# Patient Record
Sex: Male | Born: 1970 | Race: White | Hispanic: No | Smoking: Former smoker
Health system: Southern US, Community
[De-identification: ages and names within clinical notes are randomized; demographics above are authoritative.]

## PROBLEM LIST (undated history)

## (undated) DIAGNOSIS — N189 Chronic kidney disease, unspecified: Secondary | ICD-10-CM

## (undated) DIAGNOSIS — J45909 Unspecified asthma, uncomplicated: Secondary | ICD-10-CM

## (undated) DIAGNOSIS — N200 Calculus of kidney: Secondary | ICD-10-CM

## (undated) HISTORY — PX: OTHER SURGICAL HISTORY: SHX169

## (undated) HISTORY — DX: Unspecified asthma, uncomplicated: J45.909

## (undated) HISTORY — PX: HERNIA REPAIR: SHX51

## (undated) HISTORY — PX: EXPLORATORY LAPAROTOMY: SUR591

---

## 2000-02-10 ENCOUNTER — Ambulatory Visit (HOSPITAL_COMMUNITY): Admission: RE | Admit: 2000-02-10 | Discharge: 2000-02-10 | Payer: Self-pay | Admitting: General Surgery

## 2006-07-30 ENCOUNTER — Ambulatory Visit (HOSPITAL_COMMUNITY): Admission: RE | Admit: 2006-07-30 | Discharge: 2006-07-30 | Payer: Self-pay | Admitting: Urology

## 2009-10-24 ENCOUNTER — Ambulatory Visit (HOSPITAL_COMMUNITY): Admission: RE | Admit: 2009-10-24 | Discharge: 2009-10-26 | Payer: Self-pay | Admitting: Urology

## 2010-09-10 LAB — TYPE AND SCREEN
ABO/RH(D): O POS
Antibody Screen: NEGATIVE

## 2010-09-10 LAB — CBC
HCT: 41.4 % (ref 39.0–52.0)
Hemoglobin: 13.6 g/dL (ref 13.0–17.0)
Hemoglobin: 15.4 g/dL (ref 13.0–17.0)
MCHC: 32.8 g/dL (ref 30.0–36.0)
MCV: 92.2 fL (ref 78.0–100.0)
Platelets: 293 10*3/uL (ref 150–400)
RBC: 4.47 MIL/uL (ref 4.22–5.81)
RBC: 5.04 MIL/uL (ref 4.22–5.81)
WBC: 14.1 10*3/uL — ABNORMAL HIGH (ref 4.0–10.5)
WBC: 8.7 10*3/uL (ref 4.0–10.5)

## 2010-11-08 NOTE — Op Note (Signed)
[facility]  Patient:    Todd Aguirre, Todd Aguirre                   MRN: [id number] Proc. Date: 02/10/00 Adm. Date:  [id number] Attending:  Glenna Fellows Tappan                           Operative Report  PREOPERATIVE DIAGNOSIS:  Bilateral inguinal hernia.  POSTOPERATIVE DIAGNOSIS:  Bilateral inguinal hernia.  PROCEDURE:  Laparoscopic repair of bilateral inguinal hernia.  SURGEON:  Dr. Johna Sheriff.  ANESTHESIA:  General.  BRIEF HISTORY:  Todd Aguirre is a 40 year old white male with a history of gradually enlarging bilateral uncomfortable inguinal hernias. Exam confirms the bilateral hernias. Options for repair were discussed and we have elected to proceed with laparoscopic bilateral repair. The nature of the procedure, its indications, risks of bleeding, infection, and recurrence were discussed and understood preoperatively. He is now brought to the operating room for this procedure.  DESCRIPTION OF PROCEDURE:  The patient was brought to the operating room, placed in supine position on the operating table and general endotracheal anesthesia was induced. A Foley catheter was placed. Antibiotics were given preoperatively. The abdomen and the groin were sterilely prepped and draped. A 1 cm incision was made at the umbilicus and the anterior fascia was incised transversely for 1 cm and the preperitoneal space entered. The balloon dissector was placed posterior to the right rectus muscle and passed with its tip to the pubic tubercle. It was inflated 25 pumps with good bilateral deployment of the balloon. The balloon was left inflated for 3 minutes and then removed and the structural balloon and CO2 pressure were applied. There was fairly good bilateral dissection. The pubic symphysis was identified. Initially on the right, the Coopers ligament was cleared down to the iliac vessel which were identified and carefully protected. The epigastric  vessels were well defined. The peritoneum was dissected off the anterior abdominal wall working laterally out to the anterior superior iliac spine. The peritoneum was dissected posteriorly working back toward the cord structures. The cord was dissected free of the internal ring and skeletonized. The peritoneum was dissected well off the cord posteriorly and there was no indirect component. There was a moderate sized direct defect present on the right. Following thorough dissection, the left side was dissected identically with essentially identical findings except for a larger direct hernia present here. Following this, the initial piece of mesh was trimmed to 4 x 6 inches and was placed on the left. The mesh was unfurled in position and tacked initially to the pubic symphysis and then to the Coopers ligament down to the iliac vessels which were protected. The mesh was tacked along the midline working inferior to superior and then along the anterior abdominal wall working medial to lateral bordering the epigastric vessels and placing the superior lateral corner out to the iliac crest. This provided nice wide coverage of the direct and indirect spaces. Following this, a piece of mesh was identically trimmed and placed and tacked on the right side. A pneumoperitoneum was noted although there was no apparent hole in the peritoneum. This was evacuated with a Veress needle. The lateral inferior corners of the mesh were held in place while CO2 was removed and the trocars were removed. The fascial defects were closed with umbilicus with figure-of-eight suture of #0 Vicryl. The skin incisions were closed with interrupted 4-0 monocryl and Steri-Strips. Sponge, needle and instrument  counts were correct. Dry sterile dressings were applied and the patient was taken to the recovery room in good condition. DD:  02/10/00 TD:  02/10/00 Job: [id number]

## 2012-11-30 ENCOUNTER — Encounter (INDEPENDENT_AMBULATORY_CARE_PROVIDER_SITE_OTHER): Payer: Self-pay

## 2012-11-30 ENCOUNTER — Telehealth (INDEPENDENT_AMBULATORY_CARE_PROVIDER_SITE_OTHER): Payer: Self-pay

## 2012-11-30 NOTE — Telephone Encounter (Signed)
Attempted to reach the patient.  Voice mail not set up.  Work number invalid.  Pt is referred by [facility] for thyroid.  Calcium and parathyroid hormone levels drawn but do not know by whom.  Dr. Retta Diones does not have.

## 2012-12-02 ENCOUNTER — Encounter (INDEPENDENT_AMBULATORY_CARE_PROVIDER_SITE_OTHER): Payer: Self-pay

## 2012-12-29 ENCOUNTER — Ambulatory Visit (INDEPENDENT_AMBULATORY_CARE_PROVIDER_SITE_OTHER): Payer: 59 | Admitting: General Surgery

## 2013-01-21 ENCOUNTER — Encounter (INDEPENDENT_AMBULATORY_CARE_PROVIDER_SITE_OTHER): Payer: Self-pay

## 2013-02-11 ENCOUNTER — Ambulatory Visit (INDEPENDENT_AMBULATORY_CARE_PROVIDER_SITE_OTHER): Payer: 59 | Admitting: General Surgery

## 2013-03-22 ENCOUNTER — Encounter (INDEPENDENT_AMBULATORY_CARE_PROVIDER_SITE_OTHER): Payer: Self-pay | Admitting: General Surgery

## 2013-03-22 ENCOUNTER — Ambulatory Visit (INDEPENDENT_AMBULATORY_CARE_PROVIDER_SITE_OTHER): Payer: 59 | Admitting: General Surgery

## 2013-03-22 VITALS — BP 122/80 | HR 72 | Resp 14 | Ht 69.0 in | Wt 138.8 lb

## 2013-03-22 DIAGNOSIS — E210 Primary hyperparathyroidism: Secondary | ICD-10-CM

## 2013-03-22 NOTE — Patient Instructions (Signed)
Recommend a Sestamibi scan as we discussed.

## 2013-03-22 NOTE — Progress Notes (Addendum)
Patient ID: Todd Aguirre, male   DOB: Feb 10, 1971, 42 y.o.   MRN: [id number]  Chief Complaint  Patient presents with  . New Evaluation    eval hyperparathyroidism    HPI Todd Aguirre is a 42 y.o. male.   HPI  He is referred by Dr. Retta Diones for further evaluation and treatment of hyperparathyroidism.  He has had problems with kidney stones since he was 18. His kidney stones are calcium oxalate. 24 hour urine collection  demonstrated over saturation of calcium oxalate. He was noted to have hypercalcemia. Ionized calcium is elevated at 1.36. Parathyroid hormone is elevated at 117.7. His father has kidney stones.  Past Medical History  Diagnosis Date  . Asthma     Past Surgical History  Procedure Laterality Date  . Exploratory laparotomy    . Hernia repair      inguinal x 2  . Percutaneous lithotomy      stone over 2 cm    Family History  Problem Relation Age of Onset  . Cancer Mother     breast  . Cancer Father     throat    Social History History  Substance Use Topics  . Smoking status: Former Games developer  . Smokeless tobacco: Never Used  . Alcohol Use: Yes    Allergies  Allergen Reactions  . Oxycodone Hcl     No current outpatient prescriptions on file.   No current facility-administered medications for this visit.    Review of Systems Review of Systems  Constitutional: Negative.   Respiratory: Negative.   Cardiovascular: Negative.   Gastrointestinal: Positive for abdominal pain, diarrhea and constipation.  Genitourinary: Negative for difficulty urinating.  Hematological: Negative.     Blood pressure 122/80, pulse 72, resp. rate 14, height 5\' 9"  (1.753 m), weight 138 lb 12.8 oz (62.959 kg).  Physical Exam Physical Exam  Constitutional: No distress.  A thin male  HENT:  Head: Normocephalic and atraumatic.  Eyes: No scleral icterus.  Neck: Neck supple. No tracheal deviation present. No thyromegaly present.  No palpable mass  Cardiovascular:  Normal rate and regular rhythm.   Pulmonary/Chest: Effort normal and breath sounds normal.  Abdominal: Soft. He exhibits no distension and no mass. There is no tenderness.  Lymphadenopathy:    He has no cervical adenopathy.  Skin: Skin is warm and dry.  Psychiatric: He has a normal mood and affect. His behavior is normal.    Data Reviewed Notes from Dr. Retta Diones  Assessment    Hypercalcemia and elevated parathyroid hormone level consistent with hyperparathyroidism most likely primary in origin.     Plan    I recommended a sestamibi scan to try to localize a parathyroid adenoma. If this is positive, I recommended minimally invasive parathyroidectomy. He is not sure if he wants to pay for the sestamibi scan. If it is too expensive, he stated that he would just go ahead and tolerate the kidney stones. I did not recommend that he do this. He will get a price on the sestamibi scan and decide if he wants to proceed.  If we decide to proceed with parathyroidectomy, I did explain the procedure and risks to him. The risks include but are not limited to bleeding, infection, wound healing problems, anesthesia, damage to recurrent laryngeal nerve or permanent or temporary hoarseness, residual hyperparathyroidism due to a second gland or hyperplasia.        Janitza Revuelta J 03/22/2013, 5:21 PM

## 2013-03-23 ENCOUNTER — Other Ambulatory Visit (INDEPENDENT_AMBULATORY_CARE_PROVIDER_SITE_OTHER): Payer: Self-pay | Admitting: General Surgery

## 2013-03-23 ENCOUNTER — Telehealth (INDEPENDENT_AMBULATORY_CARE_PROVIDER_SITE_OTHER): Payer: Self-pay

## 2013-03-23 DIAGNOSIS — E213 Hyperparathyroidism, unspecified: Secondary | ICD-10-CM

## 2013-03-23 NOTE — Telephone Encounter (Signed)
Pt will be calling with information regarding his sestamibi scan.  Please ask for Nataleigh Griffin.

## 2013-03-24 ENCOUNTER — Telehealth (INDEPENDENT_AMBULATORY_CARE_PROVIDER_SITE_OTHER): Payer: Self-pay | Admitting: *Deleted

## 2013-03-24 NOTE — Telephone Encounter (Signed)
I called pt to inform him of his appt for his NM parathyroid scan at [facility] on 03/31/13 with an arrival time of 7:45am.

## 2013-03-31 ENCOUNTER — Encounter (HOSPITAL_COMMUNITY)
Admission: RE | Admit: 2013-03-31 | Discharge: 2013-03-31 | Disposition: A | Discharge status: Home / Self Care | Payer: 59 | Source: Ambulatory Visit | Attending: General Surgery | Admitting: General Surgery

## 2013-03-31 ENCOUNTER — Encounter (HOSPITAL_COMMUNITY): Payer: Self-pay

## 2013-03-31 DIAGNOSIS — E213 Hyperparathyroidism, unspecified: Secondary | ICD-10-CM | POA: Insufficient documentation

## 2013-03-31 MED ORDER — TECHNETIUM TC 99M SESTAMIBI GENERIC - CARDIOLITE
25.7000 | Freq: Once | INTRAVENOUS | Status: AC | PRN
  Administered 2013-03-31: 25.7 via INTRAVENOUS

## 2013-04-06 ENCOUNTER — Other Ambulatory Visit (INDEPENDENT_AMBULATORY_CARE_PROVIDER_SITE_OTHER): Payer: Self-pay | Admitting: General Surgery

## 2013-04-06 ENCOUNTER — Encounter (INDEPENDENT_AMBULATORY_CARE_PROVIDER_SITE_OTHER): Payer: Self-pay | Admitting: General Surgery

## 2013-04-06 ENCOUNTER — Telehealth (INDEPENDENT_AMBULATORY_CARE_PROVIDER_SITE_OTHER): Payer: Self-pay | Admitting: *Deleted

## 2013-04-06 DIAGNOSIS — E213 Hyperparathyroidism, unspecified: Secondary | ICD-10-CM

## 2013-04-06 NOTE — Telephone Encounter (Signed)
PT called to make me aware he is interested in going to [facility] to see an endocrinologist.  He attempted to contact them and they stated they needed a refferal order from our office.  I spoke with Cyndra Numbers and she placed an order.  I notified [facility] and spoke with [facility] and faxed her all pertinent records and she stated they would contact the patient to set up an appt.  I then called pt back and informed him that they would be contacting him.

## 2013-04-06 NOTE — Progress Notes (Signed)
Patient ID: Todd Aguirre, male   DOB: [birth year], 42 y.o.   MRN: [id number] The sestamibi scan is normal. I explained to him that it does not picked up all parathyroid adenomas and he was aware that. I discussed with him doing a neck exploration or referring him to an endocrinologist just to make sure that there are no other metabolic anomalies that could cause this. He is in agreement with seeing an endocrinologist. He'd like to see one in [place] closer to his home and he will try to make a point with them. I asked him to inform us of that appointment so that we could send records to them.  If he cannot get an appointment with them in a timely fashion, I told him I could find somebody here in [place] to see him. If they decide that there is no metabolic abnormalities to explain this, a neck exploration and parathyroidectomy would be the next in my opinion and I discussed this with him.

## 2013-04-13 ENCOUNTER — Encounter (INDEPENDENT_AMBULATORY_CARE_PROVIDER_SITE_OTHER): Payer: Self-pay

## 2015-11-25 ENCOUNTER — Encounter (HOSPITAL_COMMUNITY): Payer: Self-pay | Admitting: Emergency Medicine

## 2015-11-25 ENCOUNTER — Inpatient Hospital Stay (HOSPITAL_COMMUNITY)
Admission: EM | Admit: 2015-11-25 | Discharge: 2015-11-27 | DRG: 897 | Disposition: A | Discharge status: Home / Self Care | Payer: 59 | Attending: Internal Medicine | Admitting: Internal Medicine

## 2015-11-25 DIAGNOSIS — J45909 Unspecified asthma, uncomplicated: Secondary | ICD-10-CM | POA: Diagnosis present

## 2015-11-25 DIAGNOSIS — N179 Acute kidney failure, unspecified: Secondary | ICD-10-CM | POA: Diagnosis present

## 2015-11-25 DIAGNOSIS — F10230 Alcohol dependence with withdrawal, uncomplicated: Principal | ICD-10-CM | POA: Diagnosis present

## 2015-11-25 DIAGNOSIS — F10930 Alcohol use, unspecified with withdrawal, uncomplicated: Secondary | ICD-10-CM

## 2015-11-25 DIAGNOSIS — Z888 Allergy status to other drugs, medicaments and biological substances status: Secondary | ICD-10-CM

## 2015-11-25 DIAGNOSIS — E872 Acidosis, unspecified: Secondary | ICD-10-CM | POA: Diagnosis present

## 2015-11-25 DIAGNOSIS — Z87891 Personal history of nicotine dependence: Secondary | ICD-10-CM

## 2015-11-25 DIAGNOSIS — R112 Nausea with vomiting, unspecified: Secondary | ICD-10-CM | POA: Diagnosis not present

## 2015-11-25 DIAGNOSIS — R1013 Epigastric pain: Secondary | ICD-10-CM

## 2015-11-25 DIAGNOSIS — E8729 Other acidosis: Secondary | ICD-10-CM

## 2015-11-25 DIAGNOSIS — E860 Dehydration: Secondary | ICD-10-CM | POA: Diagnosis present

## 2015-11-25 DIAGNOSIS — R000 Tachycardia, unspecified: Secondary | ICD-10-CM | POA: Diagnosis present

## 2015-11-25 DIAGNOSIS — Z885 Allergy status to narcotic agent status: Secondary | ICD-10-CM

## 2015-11-25 DIAGNOSIS — F10939 Alcohol use, unspecified with withdrawal, unspecified: Secondary | ICD-10-CM | POA: Insufficient documentation

## 2015-11-25 DIAGNOSIS — F10239 Alcohol dependence with withdrawal, unspecified: Secondary | ICD-10-CM | POA: Insufficient documentation

## 2015-11-25 LAB — COMPREHENSIVE METABOLIC PANEL
ALBUMIN: 4.7 g/dL (ref 3.5–5.0)
ALK PHOS: 144 U/L — AB (ref 38–126)
ALT: 63 U/L (ref 17–63)
ANION GAP: 29 — AB (ref 5–15)
AST: 128 U/L — ABNORMAL HIGH (ref 15–41)
BILIRUBIN TOTAL: 1.8 mg/dL — AB (ref 0.3–1.2)
BUN: 24 mg/dL — ABNORMAL HIGH (ref 6–20)
CALCIUM: 8.6 mg/dL — AB (ref 8.9–10.3)
CO2: 12 mmol/L — ABNORMAL LOW (ref 22–32)
Chloride: 95 mmol/L — ABNORMAL LOW (ref 101–111)
Creatinine, Ser: 1.34 mg/dL — ABNORMAL HIGH (ref 0.61–1.24)
GFR calc Af Amer: >60 mL/min (ref >60)
GLUCOSE: 189 mg/dL — AB (ref 65–99)
POTASSIUM: 3.7 mmol/L (ref 3.5–5.1)
Sodium: 136 mmol/L (ref 135–145)
TOTAL PROTEIN: 7.9 g/dL (ref 6.5–8.1)

## 2015-11-25 LAB — LIPASE, BLOOD: Lipase: 40 U/L (ref 11–51)

## 2015-11-25 LAB — CBC
HEMATOCRIT: 40.7 % (ref 39.0–52.0)
HEMOGLOBIN: 13.6 g/dL (ref 13.0–17.0)
MCH: 32 pg (ref 26.0–34.0)
MCHC: 33.4 g/dL (ref 30.0–36.0)
MCV: 95.8 fL (ref 78.0–100.0)
Platelets: 275 10*3/uL (ref 150–400)
RBC: 4.25 MIL/uL (ref 4.22–5.81)
RDW: 14.2 % (ref 11.5–15.5)
WBC: 19.2 10*3/uL — AB (ref 4.0–10.5)

## 2015-11-25 MED ORDER — ONDANSETRON 4 MG PO TBDP
4.0000 mg | ORAL_TABLET | Freq: Once | ORAL | Status: AC | PRN
  Administered 2015-11-25: 4 mg via ORAL
  Filled 2015-11-25: qty 1

## 2015-11-25 NOTE — ED Notes (Signed)
Pt here from EMS with complaints of nausea and emesis that began today. Pt states his pain is in the middle upper portion of his belly. Pt states his pain all began today. Pt states he has had "too many instances of emesis to count". Pt denies urinary symptoms or diarrhea.

## 2015-11-26 ENCOUNTER — Emergency Department (HOSPITAL_COMMUNITY): Payer: 59

## 2015-11-26 DIAGNOSIS — R000 Tachycardia, unspecified: Secondary | ICD-10-CM | POA: Diagnosis present

## 2015-11-26 DIAGNOSIS — Z888 Allergy status to other drugs, medicaments and biological substances status: Secondary | ICD-10-CM | POA: Diagnosis not present

## 2015-11-26 DIAGNOSIS — E8729 Other acidosis: Secondary | ICD-10-CM | POA: Diagnosis present

## 2015-11-26 DIAGNOSIS — R112 Nausea with vomiting, unspecified: Secondary | ICD-10-CM | POA: Diagnosis present

## 2015-11-26 DIAGNOSIS — F10230 Alcohol dependence with withdrawal, uncomplicated: Secondary | ICD-10-CM | POA: Diagnosis not present

## 2015-11-26 DIAGNOSIS — E872 Acidosis, unspecified: Secondary | ICD-10-CM | POA: Diagnosis present

## 2015-11-26 DIAGNOSIS — F1020 Alcohol dependence, uncomplicated: Secondary | ICD-10-CM

## 2015-11-26 DIAGNOSIS — R1110 Vomiting, unspecified: Secondary | ICD-10-CM

## 2015-11-26 DIAGNOSIS — N179 Acute kidney failure, unspecified: Secondary | ICD-10-CM

## 2015-11-26 DIAGNOSIS — Z87891 Personal history of nicotine dependence: Secondary | ICD-10-CM | POA: Diagnosis not present

## 2015-11-26 DIAGNOSIS — Z885 Allergy status to narcotic agent status: Secondary | ICD-10-CM | POA: Diagnosis not present

## 2015-11-26 DIAGNOSIS — E860 Dehydration: Secondary | ICD-10-CM | POA: Diagnosis not present

## 2015-11-26 DIAGNOSIS — J45909 Unspecified asthma, uncomplicated: Secondary | ICD-10-CM | POA: Diagnosis present

## 2015-11-26 LAB — URINALYSIS, ROUTINE W REFLEX MICROSCOPIC
BILIRUBIN URINE: NEGATIVE
Glucose, UA: NEGATIVE mg/dL
Leukocytes, UA: NEGATIVE
NITRITE: NEGATIVE
Protein, ur: 100 mg/dL — AB
Specific Gravity, Urine: 1.016 (ref 1.005–1.030)
pH: 5.5 (ref 5.0–8.0)

## 2015-11-26 LAB — BASIC METABOLIC PANEL
ANION GAP: 12 (ref 5–15)
BUN: 18 mg/dL (ref 6–20)
CO2: 17 mmol/L — AB (ref 22–32)
Calcium: 7.3 mg/dL — ABNORMAL LOW (ref 8.9–10.3)
Chloride: 110 mmol/L (ref 101–111)
Creatinine, Ser: 1.03 mg/dL (ref 0.61–1.24)
GFR calc Af Amer: >60 mL/min (ref >60)
GFR calc non Af Amer: >60 mL/min (ref >60)
GLUCOSE: 99 mg/dL (ref 65–99)
Potassium: 3.7 mmol/L (ref 3.5–5.1)
Sodium: 139 mmol/L (ref 135–145)

## 2015-11-26 LAB — I-STAT CG4 LACTIC ACID, ED: Lactic Acid, Venous: 0.64 mmol/L (ref 0.5–2.0)

## 2015-11-26 LAB — URINE MICROSCOPIC-ADD ON

## 2015-11-26 LAB — CK: Total CK: 197 U/L (ref 49–397)

## 2015-11-26 MED ORDER — SODIUM CHLORIDE 0.9 % IV BOLUS (SEPSIS)
1000.0000 mL | Freq: Once | INTRAVENOUS | Status: AC
  Administered 2015-11-26: 1000 mL via INTRAVENOUS

## 2015-11-26 MED ORDER — LORAZEPAM 2 MG/ML IJ SOLN: 0.0000 mg | Freq: Four times a day (QID) | INTRAMUSCULAR | Status: DC

## 2015-11-26 MED ORDER — LORAZEPAM 2 MG/ML IJ SOLN
1.0000 mg | Freq: Once | INTRAMUSCULAR | Status: AC
  Administered 2015-11-26: 1 mg via INTRAVENOUS
  Filled 2015-11-26: qty 1

## 2015-11-26 MED ORDER — SODIUM CHLORIDE 0.9% FLUSH
3.0000 mL | Freq: Two times a day (BID) | INTRAVENOUS | Status: DC
  Administered 2015-11-26: 3 mL via INTRAVENOUS

## 2015-11-26 MED ORDER — MORPHINE SULFATE (PF) 4 MG/ML IV SOLN
4.0000 mg | Freq: Once | INTRAVENOUS | Status: AC
  Administered 2015-11-26: 4 mg via INTRAVENOUS
  Filled 2015-11-26: qty 1

## 2015-11-26 MED ORDER — ONDANSETRON HCL 4 MG/2ML IJ SOLN: 4.0000 mg | Freq: Four times a day (QID) | INTRAMUSCULAR | Status: DC | PRN

## 2015-11-26 MED ORDER — GI COCKTAIL ~~LOC~~
30.0000 mL | Freq: Once | ORAL | Status: AC
  Administered 2015-11-26: 30 mL via ORAL
  Filled 2015-11-26: qty 30

## 2015-11-26 MED ORDER — LORAZEPAM 2 MG/ML IJ SOLN: 0.0000 mg | Freq: Two times a day (BID) | INTRAMUSCULAR | Status: DC

## 2015-11-26 MED ORDER — THIAMINE HCL 100 MG/ML IJ SOLN
Freq: Once | INTRAVENOUS | Status: AC
  Administered 2015-11-26: 08:00:00 via INTRAVENOUS
  Filled 2015-11-26: qty 1000

## 2015-11-26 MED ORDER — FAMOTIDINE IN NACL 20-0.9 MG/50ML-% IV SOLN
20.0000 mg | Freq: Two times a day (BID) | INTRAVENOUS | Status: DC
  Administered 2015-11-26: 20 mg via INTRAVENOUS
  Filled 2015-11-26 (×2): qty 50

## 2015-11-26 MED ORDER — VITAMIN B-1 100 MG PO TABS
100.0000 mg | ORAL_TABLET | Freq: Every day | ORAL | Status: DC
Start: 2015-11-26 — End: 2015-11-27
  Administered 2015-11-26 – 2015-11-27 (×2): 100 mg via ORAL
  Filled 2015-11-26 (×2): qty 1

## 2015-11-26 MED ORDER — THIAMINE HCL 100 MG/ML IJ SOLN
100.0000 mg | Freq: Every day | INTRAMUSCULAR | Status: DC
  Filled 2015-11-26 (×2): qty 1

## 2015-11-26 MED ORDER — FAMOTIDINE IN NACL 20-0.9 MG/50ML-% IV SOLN
20.0000 mg | Freq: Two times a day (BID) | INTRAVENOUS | Status: DC
  Administered 2015-11-26 – 2015-11-27 (×2): 20 mg via INTRAVENOUS
  Filled 2015-11-26 (×2): qty 50

## 2015-11-26 MED ORDER — ONDANSETRON HCL 4 MG/2ML IJ SOLN
4.0000 mg | Freq: Once | INTRAMUSCULAR | Status: AC
  Administered 2015-11-26: 4 mg via INTRAVENOUS
  Filled 2015-11-26: qty 2

## 2015-11-26 MED ORDER — SODIUM CHLORIDE 0.9 % IV SOLN
INTRAVENOUS | Status: DC
  Administered 2015-11-26 – 2015-11-27 (×2): via INTRAVENOUS

## 2015-11-26 NOTE — H&P (Signed)
[facility] History and Physical  JONTHAN LEITE AVW:[id number] DOB: December 16, 1970 DOA: 11/25/2015  Referring physician:  PCP: Horald Pollen., PA-C  Specialists:   Chief Complaint: nausea, vomiting   HPI: Todd Aguirre is a 45 y.o. male with chronic alcoholism presented with nausea, vomiting. He reports daily alcohol use for more than ten years. Denies h/o severe withdrawals. He woke up this morning, drank some coke then he started vomiting immediately. He had multiple episode of non bloody vomiting >10 times. He reports mild periumbilical abdominal discomfort with vomiting, denies acute abdominal pains, no diarrhea, +flatus. Denies fever, chills, no sick contacts. No chest pains, no SOB, no focal neuro symptoms. Denies any bleeding   -ED: tachycardic, dehydrated. Started IV fluids, CIWA for etoh withdrawals    Review of Systems: The patient denies anorexia, fever, weight loss,, vision loss, decreased hearing, hoarseness, chest pain, syncope, dyspnea on exertion, peripheral edema, balance deficits, hemoptysis, abdominal pain, melena, hematochezia, severe indigestion/heartburn, hematuria, incontinence, genital sores, muscle weakness, suspicious skin lesions, transient blindness, difficulty walking, depression, unusual weight change, abnormal bleeding, enlarged lymph nodes, angioedema, and breast masses.    Past Medical History  Diagnosis Date  . Asthma    Past Surgical History  Procedure Laterality Date  . Exploratory laparotomy    . Hernia repair      inguinal x 2  . Percutaneous lithotomy      stone over 2 cm   Social History:  reports that he has quit smoking. He has never used smokeless tobacco. He reports that he drinks alcohol. He reports that he does not use illicit drugs. Home;  where does patient live--home, ALF, SNF? and with whom if at home? Yes;  Can patient participate in ADLs?  Allergies  Allergen Reactions  . Sertraline Hcl Other (See Comments)    Sexual  dysfunction  . Oxycodone Hcl     Family History  Problem Relation Age of Onset  . Cancer Mother     breast  . Cancer Father     throat    (be sure to complete)  Prior to Admission medications   Not on File   Physical Exam: Filed Vitals:   11/26/15 0725 11/26/15 0728  BP: 121/57 121/57  Pulse: 120 117  Temp:    Resp:  17     General:  Alert, reports nausea  Eyes: eom-i, perrla   ENT: no oral ulcers   Neck: supple, no JVD  Cardiovascular: s1,s2 tachycardia   Respiratory: CTA BL  Abdomen:  Soft, NT, ND  Skin: no rash   Musculoskeletal: no leg edema  Psychiatric: no hallucinations   Neurologic: CN 2-12 intact, motor 5/5 BL symmetric   Labs on Admission:  Basic Metabolic Panel:  Recent Labs Lab 11/25/15 2224  NA 136  K 3.7  CL 95*  CO2 12*  GLUCOSE 189*  BUN 24*  CREATININE 1.34*  CALCIUM 8.6*   Liver Function Tests:  Recent Labs Lab 11/25/15 2224  AST 128*  ALT 63  ALKPHOS 144*  BILITOT 1.8*  PROT 7.9  ALBUMIN 4.7    Recent Labs Lab 11/25/15 2224  LIPASE 40   No results for input(s): AMMONIA in the last 168 hours. CBC:  Recent Labs Lab 11/25/15 2224  WBC 19.2*  HGB 13.6  HCT 40.7  MCV 95.8  PLT 275   Cardiac Enzymes: No results for input(s): CKTOTAL, CKMB, CKMBINDEX, TROPONINI in the last 168 hours.  BNP (last 3 results) No results for input(s): BNP in the  last 8760 hours.  ProBNP (last 3 results) No results for input(s): PROBNP in the last 8760 hours.  CBG: No results for input(s): GLUCAP in the last 168 hours.  Radiological Exams on Admission: Dg Abd 1 View  11/26/2015  CLINICAL DATA:  Abdominal pain and vomiting. EXAM: ABDOMEN - 1 VIEW COMPARISON:  06/12/2014 FINDINGS: Nonobstructive bowel gas pattern. No concerning intra-abdominal mass effect or calcification. No abnormal stool retention. Previous mesh hernia repair over the lower abdomen. IMPRESSION: Negative.  Normal bowel gas pattern. Electronically Signed    By: Marnee SpringJonathon  Watts M.D.   On: 11/26/2015 03:57   [place] Abdomen Limited Ruq  11/26/2015  CLINICAL DATA:  Epigastric pain EXAM: US ABDOMEN LIMITED - RIGHT UPPER QUADRANT COMPARISON:  Abdominal CT 08/10/2009 FINDINGS: Gallbladder: No gallstones or wall thickening visualized. No sonographic Murphy sign noted by sonographer. Common bile duct: Diameter: 4 mm. Liver: No focal lesion identified. Within normal limits in parenchymal echogenicity. Antegrade flow in the imaged hepatic and portal venous system. Hyperechoic focus correlating with calcification on 2011 CT. No evidence of interval growth or increased complexity. IMPRESSION: No acute finding.  Negative gallbladder. Electronically Signed   By: Marnee SpringJonathon  Watts M.D.   On: 11/26/2015 06:57    EKG: Independently reviewed.   Assessment/Plan Active Problems:   Alcoholic ketoacidosis   45 y.o. male with chronic alcoholism presented with nausea, vomiting. Admitted with AKI, dehydration, acidosis  Suspected alcoholic ketoacidosis. AG acidosis. We will cont IVF hydration, replace lytes, check lactic acid. Recheck BMP. glucose ->189 r/o DM, check ha1c; cont iSS  AKI probable prerenal due to severe dehydration. Cont IVF, check CK, monitor urine output.  Nausea, vomiting likely alcohol induced. abd exam is benign. KUB/Abd US: unremarkable. Cont IVF, antiemetics, supportive care.  Chronic alcoholism. Tachycardic, no hallucinations. At risk for DTs. Cont monitor on CIWA, IVF, thiamine   -Elevated AST due to alcoholism. Mild elevated bili at 1.8. abd US as above. Recheck labs AM  Leukocytosis likely reactive. Afebrile. Cont monitor    None;  if consultant consulted, please document name and whether formally or informally consulted  Code Status: Full (must indicate code status--if unknown or must be presumed, indicate so) Family Communication: d/w patient, his mother  (indicate person spoken with, if applicable, with phone number if by  telephone) Disposition Plan: home when clinically stable  (indicate anticipated LOS)  Time spent: >45 minutes   Esperanza SheetsBURIEV, Alexcis Bicking N [facility] Pager [phone]  If 7PM-7AM, please contact night-coverage [email or web address] Password [email or web address] 11/26/2015, 7:54 AM

## 2015-11-26 NOTE — ED Provider Notes (Addendum)
CSN: [id number]     Arrival date & time 11/25/15  2135 History  By signing my name below, I, [facility], attest that this documentation has been prepared under the direction and in the presence of Shon Baton, MD. Electronically Signed: Randell Patient, ED Scribe. 11/26/2015. 4:07 AM.   Chief Complaint  Patient presents with  . Nausea  . Emesis     The history is provided by the patient. No language interpreter was used.   HPI Comments: Todd Aguirre is a 45 y.o. male who presents to the Emergency Department complaining of intermittent, moderate emesis onset yesterday morning after drinking a soda. Pt states the he woke this morning and drank a Coke that he immediately vomited up followed by multiple episodes of emesis yesterday, more than 30 times in the past 24 hours. He reports associated 6/10 periumbilical abdominal pain, nausea, and 7/10 sore throat secondary to emesis. He has attempted to eat and drink but has been unable to keep food or liquids down due to vomiting. He has recent sick contact with a child who was diagnosed with strep throat. Denies BMs today. Denies hx of bowel obstruction. Denies fevers or any other symptoms currently.  Of note, patient does report daily drinking. He states that he is an alcoholic.  Never had withdrawal seizures. Last drank greater than 24 hours ago.  Past Medical History  Diagnosis Date  . Asthma    Past Surgical History  Procedure Laterality Date  . Exploratory laparotomy    . Hernia repair      inguinal x 2  . Percutaneous lithotomy      stone over 2 cm   Family History  Problem Relation Age of Onset  . Cancer Mother     breast  . Cancer Father     throat   Social History  Substance Use Topics  . Smoking status: Former Games developer  . Smokeless tobacco: Never Used  . Alcohol Use: Yes    Review of Systems  Constitutional: Negative for fever.  HENT: Positive for sore throat.   Respiratory: Negative for shortness  of breath.   Cardiovascular: Negative for chest pain.  Gastrointestinal: Positive for nausea, vomiting and abdominal pain.  All other systems reviewed and are negative.     Allergies  Sertraline hcl and Oxycodone hcl  Home Medications   Prior to Admission medications   Not on File   BP 121/57 mmHg  Pulse 117  Temp(Src) 98 F (36.7 C) (Oral)  Resp 17  SpO2 99% Physical Exam  Constitutional: He is oriented to person, place, and time. He appears well-developed and well-nourished.  No acute distress  HENT:  Head: Normocephalic and atraumatic.  Mucous membranes dry  Cardiovascular: Normal rate, regular rhythm and normal heart sounds.   No murmur heard. Pulmonary/Chest: Effort normal and breath sounds normal. No respiratory distress. He has no wheezes.  Abdominal: Soft. Bowel sounds are normal. There is tenderness. There is no rebound and no guarding.  Epigastric tenderness to palpation without rebound or guarding  Musculoskeletal: He exhibits no edema.  Neurological: He is alert and oriented to person, place, and time.  Skin: Skin is warm and dry.  Psychiatric: He has a normal mood and affect.  Nursing note and vitals reviewed.   ED Course  Procedures (including critical care time)  CRITICAL CARE Performed by: Shon Baton   Total critical care time: 25 minutes  Critical care time was exclusive of separately billable procedures and treating other  patients.  Critical care was necessary to treat or prevent imminent or life-threatening deterioration.  Critical care was time spent personally by me on the following activities: development of treatment plan with patient and/or surrogate as well as nursing, discussions with consultants, evaluation of patient's response to treatment, examination of patient, obtaining history from patient or surrogate, ordering and performing treatments and interventions, ordering and review of laboratory studies, ordering and review of  radiographic studies, pulse oximetry and re-evaluation of patient's condition.   DIAGNOSTIC STUDIES: Oxygen Saturation is 100% on RA, normal by my interpretation.    COORDINATION OF CARE: 3:06 AM Discussed treatment plan with pt at bedside and pt agreed to plan.   Labs Review Labs Reviewed  COMPREHENSIVE METABOLIC PANEL - Abnormal; Notable for the following:    Chloride 95 (*)    CO2 12 (*)    Glucose, Bld 189 (*)    BUN 24 (*)    Creatinine, Ser 1.34 (*)    Calcium 8.6 (*)    AST 128 (*)    Alkaline Phosphatase 144 (*)    Total Bilirubin 1.8 (*)    Anion gap 29 (*)    All other components within normal limits  CBC - Abnormal; Notable for the following:    WBC 19.2 (*)    All other components within normal limits  URINALYSIS, ROUTINE W REFLEX MICROSCOPIC (NOT AT [facility]) - Abnormal; Notable for the following:    APPearance CLOUDY (*)    Hgb urine dipstick MODERATE (*)    Ketones, ur >80 (*)    Protein, ur 100 (*)    All other components within normal limits  URINE MICROSCOPIC-ADD ON - Abnormal; Notable for the following:    Squamous Epithelial / LPF 0-5 (*)    Bacteria, UA RARE (*)    Casts HYALINE CASTS (*)    All other components within normal limits  LIPASE, BLOOD  I-STAT CG4 LACTIC ACID, ED    Imaging Review Dg Abd 1 View  11/26/2015  CLINICAL DATA:  Abdominal pain and vomiting. EXAM: ABDOMEN - 1 VIEW COMPARISON:  06/12/2014 FINDINGS: Nonobstructive bowel gas pattern. No concerning intra-abdominal mass effect or calcification. No abnormal stool retention. Previous mesh hernia repair over the lower abdomen. IMPRESSION: Negative.  Normal bowel gas pattern. Electronically Signed   By: Marnee Spring M.D.   On: 11/26/2015 03:57   US Abdomen Limited Ruq  11/26/2015  CLINICAL DATA:  Epigastric pain EXAM: US ABDOMEN LIMITED - RIGHT UPPER QUADRANT COMPARISON:  Abdominal CT 08/10/2009 FINDINGS: Gallbladder: No gallstones or wall thickening visualized. No sonographic Murphy sign  noted by sonographer. Common bile duct: Diameter: 4 mm. Liver: No focal lesion identified. Within normal limits in parenchymal echogenicity. Antegrade flow in the imaged hepatic and portal venous system. Hyperechoic focus correlating with calcification on 2011 CT. No evidence of interval growth or increased complexity. IMPRESSION: No acute finding.  Negative gallbladder. Electronically Signed   By: Marnee Spring M.D.   On: 11/26/2015 06:57   I have personally reviewed and evaluated these images and lab results as part of my medical decision-making.   EKG Interpretation   Date/Time:  Monday November 26 2015 05:59:38 EDT Ventricular Rate:  127 PR Interval:  135 QRS Duration: 74 QT Interval:  304 QTC Calculation: 442 R Axis:   79 Text Interpretation:  Sinus tachycardia ST elev, probable normal early  repol pattern No prior for comparison Confirmed by Primo Innis  MD, Desaray Marschner  ([id number]) on 11/26/2015 6:10:54 AM  MDM   Final diagnoses:  Epigastric pain  Alcohol withdrawal, uncomplicated (HCC)  Dehydration  Alcoholic ketoacidosis    Patient presents with vomiting and abdominal pain. He is a daily drinker. He is ill-appearing but nontoxic. Tender on palpation without signs of peritonitis. Patient was given pain and nausea medication.  KUB without evidence of obstruction. Lab work notable for anion gap of 29 and bicarbonate of 12. Patient was given 3 L of fluid. He was also given 1 mg of Ativan 2. Heart rate was persistently elevated in the 120s to 130s. EKG was sinus tachycardia. Suspect alcoholic gastritis in addition to now acute alcohol withdrawal given tachycardia. He may also be an alcoholic ketoacidosis versus multifactorial anion gap acidosis. Patient was placed on CIWA and he was given a banana bag with thiamine. Will admit for acute withdrawal and metabolic derangement likely secondary to alcohol abuse and withdrawal.  I personally performed the services described in this documentation,  which was scribed in my presence. The recorded information has been reviewed and is accurate.    Shon Batonourtney F Charday Capetillo, MD 11/26/15 [id number]  Shon Batonourtney F Betzy Barbier, MD 11/26/15 [phone]

## 2015-11-26 NOTE — ED Notes (Signed)
Hospitalist at bedside 

## 2015-11-26 NOTE — Care Management Note (Signed)
Case Management Note  Patient Details  Name: Todd Aguirre MRN: [id number] Date of Birth: 06/04/1971  Subjective/Objective:        etoh w/d            Action/Plan: Date:  November 26, 2015 Chart reviewed for concurrent status and case management needs. Will continue to follow the patient for changes and needs: Expected discharge date: [id number] Marcelle SmilingRhonda Daris Harkins, BSN, [place]RN3, [place]CCM   [phone]  Expected Discharge Date:   (unknown)               Expected Discharge Plan:  Home/Self Care  In-House Referral:  Clinical Social Work  Discharge planning Services  CM Consult  Post Acute Care Choice:  NA Choice offered to:  NA  DME Arranged:    DME Agency:     HH Arranged:    HH Agency:     Status of Service:  In process, will continue to follow  Medicare Important Message Given:    Date Medicare IM Given:    Medicare IM give by:    Date Additional Medicare IM Given:    Additional Medicare Important Message give by:     If discussed at Long Length of Stay Meetings, dates discussed:    Additional Comments:  Golda AcreDavis, Markeria Goetsch Lynn, RN 11/26/2015, 12:24 PM

## 2015-11-26 NOTE — ED Notes (Signed)
US at bedside

## 2015-11-27 DIAGNOSIS — F10239 Alcohol dependence with withdrawal, unspecified: Secondary | ICD-10-CM | POA: Insufficient documentation

## 2015-11-27 DIAGNOSIS — F10230 Alcohol dependence with withdrawal, uncomplicated: Principal | ICD-10-CM

## 2015-11-27 DIAGNOSIS — F10939 Alcohol use, unspecified with withdrawal, unspecified: Secondary | ICD-10-CM | POA: Insufficient documentation

## 2015-11-27 DIAGNOSIS — E860 Dehydration: Secondary | ICD-10-CM

## 2015-11-27 DIAGNOSIS — E872 Acidosis: Secondary | ICD-10-CM

## 2015-11-27 LAB — CBC
HEMATOCRIT: 31.5 % — AB (ref 39.0–52.0)
Hemoglobin: 10.7 g/dL — ABNORMAL LOW (ref 13.0–17.0)
MCH: 32 pg (ref 26.0–34.0)
MCHC: 34 g/dL (ref 30.0–36.0)
MCV: 94.3 fL (ref 78.0–100.0)
Platelets: 121 10*3/uL — ABNORMAL LOW (ref 150–400)
RBC: 3.34 MIL/uL — ABNORMAL LOW (ref 4.22–5.81)
RDW: 14.2 % (ref 11.5–15.5)
WBC: 9.1 10*3/uL (ref 4.0–10.5)

## 2015-11-27 LAB — COMPREHENSIVE METABOLIC PANEL
ALBUMIN: 3.3 g/dL — AB (ref 3.5–5.0)
ALT: 33 U/L (ref 17–63)
AST: 63 U/L — AB (ref 15–41)
Alkaline Phosphatase: 85 U/L (ref 38–126)
Anion gap: 7 (ref 5–15)
BILIRUBIN TOTAL: 0.8 mg/dL (ref 0.3–1.2)
BUN: 10 mg/dL (ref 6–20)
CO2: 24 mmol/L (ref 22–32)
Calcium: 8.3 mg/dL — ABNORMAL LOW (ref 8.9–10.3)
Chloride: 109 mmol/L (ref 101–111)
Creatinine, Ser: 0.7 mg/dL (ref 0.61–1.24)
GFR calc Af Amer: >60 mL/min (ref >60)
GFR calc non Af Amer: >60 mL/min (ref >60)
GLUCOSE: 100 mg/dL — AB (ref 65–99)
POTASSIUM: 3.4 mmol/L — AB (ref 3.5–5.1)
Sodium: 140 mmol/L (ref 135–145)
TOTAL PROTEIN: 5.7 g/dL — AB (ref 6.5–8.1)

## 2015-11-27 LAB — HEMOGLOBIN A1C
HEMOGLOBIN A1C: 5.5 % (ref 4.8–5.6)
Mean Plasma Glucose: 111 mg/dL

## 2015-11-27 MED ORDER — OMEPRAZOLE 20 MG PO CPDR: 20.0000 mg | DELAYED_RELEASE_CAPSULE | Freq: Every day | ORAL | Status: AC

## 2015-11-27 MED ORDER — FAMOTIDINE 20 MG PO TABS
20.0000 mg | ORAL_TABLET | Freq: Two times a day (BID) | ORAL | Status: DC
  Filled 2015-11-27: qty 1

## 2015-11-27 NOTE — Progress Notes (Signed)
PHARMACIST - PHYSICIAN COMMUNICATION CONCERNING: IV to Oral Route Change Policy  RECOMMENDATION: This patient is receiving Pepcid by the intravenous route.  Based on criteria approved by the Pharmacy and Therapeutics Committee, the intravenous medication(s) is/are being converted to the equivalent oral dose form(s).   DESCRIPTION: These criteria include:  The patient is eating (either orally or via tube) and/or has been taking other orally administered medications for a least 24 hours  The patient has no evidence of active gastrointestinal bleeding or impaired GI absorption (gastrectomy, short bowel, patient on TNA or NPO).  If you have questions about this conversion, please contact the Pharmacy Department  []   [phone]( [phone] )  [facility] []   [phone]( [phone] )  [facility] []   [phone]( [phone] )  [facility] []   [phone]( [phone] )  [facility] [x]   [phone]( [phone] )  [facility]   Clance BollRunyon, Kerrigan Glendening, [facility]ocquaRPH 11/27/2015 8:17 AM

## 2015-11-27 NOTE — Clinical Social Work Note (Signed)
Clinical Social Work Assessment  Patient Details  Name: Todd Aguirre MRN: [id number] Date of Birth: 04-27-1971  Date of referral:  11/27/15               Reason for consult:  Substance Use/ETOH Abuse                Permission sought to share information with:  Case Manager Permission granted to share information::     Name::        Agency::     Relationship::     Contact Information:     Housing/Transportation Living arrangements for the past 2 months:  Single Family Home Source of Information:  Patient Patient Interpreter Needed:  None Criminal Activity/Legal Involvement Pertinent to Current Situation/Hospitalization:  No - Comment as needed Significant Relationships:  Other Family Members, Parents Lives with:   Self Do you feel safe going back to the place where you live?   Yes Need for family participation in patient care:  No (Coment)  Care giving concerns:  Patient reports a history of  alcohol use since the age of [age]. Patient reports alcohol abuse runs in family but he has seen family members quit. Patient reports a history of depression after his divorce from ex-wife. Patient reports he went to a depression clinic and received services and medication. Patient reports it was helpful as he feels he does not currently have depression. Patient expresses he is ready to change due to this hospital visit learning alcohol has contributed to the issue. Patient is interested in outpatient verses inpatient due to his full time job, currently working eight hours a week. Patient has been able to quit in the past for short periods of time while taking medication sometimes, but feels he quickly returns to drinking thereafter.Patient specifically requested information on AAA treatment groups. Patient states his ex-wife completed the program successfully and feels he can do the same.   Social Worker assessment / plan  LCSWA met patient at bedside and explained reason for consult. [place]  educated patient with resources for outpatient and inpatient residential. Patient stated he is interested only in outpatient AAA at this time and wanted to know more information. LCSWA provided patient with information.   Plan: Provided patient with AAA and outpatient information for alcohol abuse.   Employment status:  [employer] information:  Other (Comment Required) ([employer]) PT Recommendations:  Not assessed at this time Information / Referral to community resources:  Outpatient Substance Abuse Treatment Options  Patient/Family's Response to care:  Agreeable  Patient/Family's Understanding of and Emotional Response to Diagnosis, Current Treatment, and Prognosis:  Patient reports he motivated to change at this time and would like outpatient materials for AAA treatment groups. Patient is aware that his health is being effected by his alcohol use.   Emotional Assessment Appearance:  Appears stated age Attitude/Demeanor/Rapport:    Affect (typically observed):  Calm, Pleasant, Hopeful Orientation:  Oriented to Self, Oriented to Place, Oriented to  Time, Oriented to Situation Alcohol / Substance use:  Alcohol Use Psych involvement (Current and /or in the community):  No (Comment)  Discharge Needs  Concerns to be addressed:  Substance Abuse Concerns Readmission within the last 30 days:  No Current discharge risk:  Substance Abuse Barriers to Discharge:  Active Substance Use   Lia Hopping, LCSW 11/27/2015, 10:56 AM

## 2015-11-27 NOTE — Progress Notes (Signed)
Patient given discharge instructions, and verbalized an understanding of all discharge instructions.  Patient agrees with discharge plan, and is being discharged in stable medical condition.  Patient ambulatory and was helped to the front door.  Patient's father to transport patient.

## 2015-12-17 NOTE — Discharge Summary (Signed)
Physician Discharge Summary  Todd SchimkeJonathan T Aguirre ZOX:[id number] DOB: 02/02/1971 DOA: 11/25/2015  PCP: Helene KelpMAIER,ANDREW C., PA-C  Admit date: 11/25/2015 Discharge date: 11/27/2015  Time spent: 45 minutes  Recommendations for Outpatient Follow-up:  1. Substance abuse/ETOH abuse, community resources given   Discharge Diagnoses:  Active Problems:   Alcoholic ketoacidosis   Acidosis   Alcohol withdrawal (HCC)   Dehydration   Discharge Condition: stable  Diet recommendation: heart healthy  Filed Weights   11/26/15 2000  Weight: 61.236 kg (135 lb)    History of present illness:  HPI: Todd SchimkeJonathan T Aguirre is a 45 y.o. male with chronic alcoholism presented with nausea, vomiting. He reported daily alcohol use for more than ten years.  He woke up  drank some coke then he started vomiting immediately. He had multiple episode of non bloody vomiting >10 times, no hemetemesis. -ED: tachycardic, dehydrated  Hospital Course:  Dehydration/Suspected alcoholic ketoacidosis. AG acidosis.  -improved with hydration, supportive care etc  AKI probable prerenal due to severe dehydration -improved with hydration, resolved  Nausea, vomiting likely alcohol induced.  abd exam was benign. KUB/Abd US: unremarkable -resolved with PPI, supportive care only  Chronic alcoholism. Tachycardic, no hallucinations.  -monitored on CIWA, IVF, thiamine  -no withdrawal noted -Elevated AST due to alcoholism. Mild elevated bili at 1.8. abd US unremarkable as above -CSW consulted, resources for ETOH counseling given  Leukocytosis -reactive, resolved  Discharge Exam: Filed Vitals:   11/26/15 2224 11/27/15 0606  BP: 138/95 141/94  Pulse: 87 80  Temp: 98.3 F (36.8 C) 98.4 F (36.9 C)  Resp: 15 15    General: AAOx3 Cardiovascular: S1S2/RRR Respiratory: CTAB  Discharge Instructions   Discharge Instructions    Diet general    Complete by:  As directed      Increase activity slowly    Complete by:  As  directed           Discharge Medication List as of 11/27/2015  1:58 PM    START taking these medications   Details  omeprazole (PRILOSEC) 20 MG capsule Take 1 capsule (20 mg total) by mouth daily. Can fill this if recurrent gastritis/reflux, Starting 11/27/2015, Until Discontinued, OTC       Allergies  Allergen Reactions  . Sertraline Hcl Other (See Comments)    Sexual dysfunction  . Oxycodone Hcl       The results of significant diagnostics from this hospitalization (including imaging, microbiology, ancillary and laboratory) are listed below for reference.    Significant Diagnostic Studies: Dg Abd 1 View  11/26/2015  CLINICAL DATA:  Abdominal pain and vomiting. EXAM: ABDOMEN - 1 VIEW COMPARISON:  06/12/2014 FINDINGS: Nonobstructive bowel gas pattern. No concerning intra-abdominal mass effect or calcification. No abnormal stool retention. Previous mesh hernia repair over the lower abdomen. IMPRESSION: Negative.  Normal bowel gas pattern. Electronically Signed   By: Marnee SpringJonathon  Watts M.D.   On: 11/26/2015 03:57   [place] Abdomen Limited Ruq  11/26/2015  CLINICAL DATA:  Epigastric pain EXAM: US ABDOMEN LIMITED - RIGHT UPPER QUADRANT COMPARISON:  Abdominal CT 08/10/2009 FINDINGS: Gallbladder: No gallstones or wall thickening visualized. No sonographic Murphy sign noted by sonographer. Common bile duct: Diameter: 4 mm. Liver: No focal lesion identified. Within normal limits in parenchymal echogenicity. Antegrade flow in the imaged hepatic and portal venous system. Hyperechoic focus correlating with calcification on 2011 CT. No evidence of interval growth or increased complexity. IMPRESSION: No acute finding.  Negative gallbladder. Electronically Signed   By: Kathrynn DuckingJonathon  Watts M.D.  On: 11/26/2015 06:57    Microbiology: No results found for this or any previous visit (from the past 240 hour(s)).   Labs: Basic Metabolic Panel: No results for input(s): NA, K, CL, CO2, GLUCOSE, BUN, CREATININE,  CALCIUM, MG, PHOS in the last 168 hours. Liver Function Tests: No results for input(s): AST, ALT, ALKPHOS, BILITOT, PROT, ALBUMIN in the last 168 hours. No results for input(s): LIPASE, AMYLASE in the last 168 hours. No results for input(s): AMMONIA in the last 168 hours. CBC: No results for input(s): WBC, NEUTROABS, HGB, HCT, MCV, PLT in the last 168 hours. Cardiac Enzymes: No results for input(s): CKTOTAL, CKMB, CKMBINDEX, TROPONINI in the last 168 hours. BNP: BNP (last 3 results) No results for input(s): BNP in the last 8760 hours.  ProBNP (last 3 results) No results for input(s): PROBNP in the last 8760 hours.  CBG: No results for input(s): GLUCAP in the last 168 hours.     SignedZannie Cove:  Cleo Santucci MD.  [facility] 12/17/2015, 10:54 AM

## 2016-10-09 ENCOUNTER — Encounter (HOSPITAL_COMMUNITY): Payer: Self-pay | Admitting: Emergency Medicine

## 2016-10-09 ENCOUNTER — Emergency Department (HOSPITAL_COMMUNITY): Payer: 59

## 2016-10-09 ENCOUNTER — Observation Stay (HOSPITAL_COMMUNITY)
Admission: EM | Admit: 2016-10-09 | Discharge: 2016-10-10 | Disposition: A | Discharge status: Home / Self Care | Payer: 59 | Attending: Family Medicine | Admitting: Family Medicine

## 2016-10-09 DIAGNOSIS — Z87891 Personal history of nicotine dependence: Secondary | ICD-10-CM | POA: Insufficient documentation

## 2016-10-09 DIAGNOSIS — N179 Acute kidney failure, unspecified: Principal | ICD-10-CM | POA: Diagnosis present

## 2016-10-09 DIAGNOSIS — E872 Acidosis, unspecified: Secondary | ICD-10-CM

## 2016-10-09 DIAGNOSIS — J45909 Unspecified asthma, uncomplicated: Secondary | ICD-10-CM | POA: Insufficient documentation

## 2016-10-09 DIAGNOSIS — R140 Abdominal distension (gaseous): Secondary | ICD-10-CM

## 2016-10-09 DIAGNOSIS — E860 Dehydration: Secondary | ICD-10-CM | POA: Diagnosis present

## 2016-10-09 DIAGNOSIS — R000 Tachycardia, unspecified: Secondary | ICD-10-CM | POA: Diagnosis not present

## 2016-10-09 DIAGNOSIS — R1013 Epigastric pain: Secondary | ICD-10-CM

## 2016-10-09 DIAGNOSIS — K5900 Constipation, unspecified: Secondary | ICD-10-CM

## 2016-10-09 DIAGNOSIS — R112 Nausea with vomiting, unspecified: Secondary | ICD-10-CM

## 2016-10-09 DIAGNOSIS — R143 Flatulence: Secondary | ICD-10-CM

## 2016-10-09 HISTORY — DX: Calculus of kidney: N20.0

## 2016-10-09 HISTORY — DX: Chronic kidney disease, unspecified: N18.9

## 2016-10-09 LAB — COMPREHENSIVE METABOLIC PANEL
ALBUMIN: 4.3 g/dL (ref 3.5–5.0)
ALK PHOS: 132 U/L — AB (ref 38–126)
ALT: 67 U/L — AB (ref 17–63)
AST: 110 U/L — AB (ref 15–41)
Anion gap: 28 — ABNORMAL HIGH (ref 5–15)
BILIRUBIN TOTAL: 1.1 mg/dL (ref 0.3–1.2)
BUN: 16 mg/dL (ref 6–20)
CALCIUM: 9.1 mg/dL (ref 8.9–10.3)
CO2: 12 mmol/L — ABNORMAL LOW (ref 22–32)
CREATININE: 1.5 mg/dL — AB (ref 0.61–1.24)
Chloride: 105 mmol/L (ref 101–111)
GFR calc Af Amer: >60 mL/min (ref >60)
GFR calc non Af Amer: 55 mL/min — ABNORMAL LOW (ref >60)
GLUCOSE: 111 mg/dL — AB (ref 65–99)
Potassium: 4.3 mmol/L (ref 3.5–5.1)
Sodium: 145 mmol/L (ref 135–145)
Total Protein: 7.7 g/dL (ref 6.5–8.1)

## 2016-10-09 LAB — PROTIME-INR
INR: 1.07
PROTHROMBIN TIME: 13.9 s (ref 11.4–15.2)

## 2016-10-09 LAB — CBC
HCT: 38.2 % — ABNORMAL LOW (ref 39.0–52.0)
Hemoglobin: 11.8 g/dL — ABNORMAL LOW (ref 13.0–17.0)
MCH: 30.6 pg (ref 26.0–34.0)
MCHC: 30.9 g/dL (ref 30.0–36.0)
MCV: 99 fL (ref 78.0–100.0)
Platelets: 396 10*3/uL (ref 150–400)
RBC: 3.86 MIL/uL — ABNORMAL LOW (ref 4.22–5.81)
RDW: 14 % (ref 11.5–15.5)
WBC: 15.1 10*3/uL — ABNORMAL HIGH (ref 4.0–10.5)

## 2016-10-09 LAB — URINALYSIS, ROUTINE W REFLEX MICROSCOPIC
BACTERIA UA: NONE SEEN
Bilirubin Urine: NEGATIVE
Glucose, UA: NEGATIVE mg/dL
KETONES UR: 80 mg/dL — AB
Leukocytes, UA: NEGATIVE
Nitrite: NEGATIVE
PH: 5 (ref 5.0–8.0)
Protein, ur: 100 mg/dL — AB
Specific Gravity, Urine: 1.012 (ref 1.005–1.030)
Squamous Epithelial / LPF: NONE SEEN

## 2016-10-09 LAB — LIPASE, BLOOD: Lipase: 15 U/L (ref 11–51)

## 2016-10-09 LAB — I-STAT TROPONIN, ED
TROPONIN I, POC: 0 ng/mL (ref 0.00–0.08)
Troponin i, poc: 0 ng/mL (ref 0.00–0.08)

## 2016-10-09 LAB — I-STAT CG4 LACTIC ACID, ED
LACTIC ACID, VENOUS: 3.58 mmol/L — AB (ref 0.5–1.9)
Lactic Acid, Venous: 4.3 mmol/L (ref 0.5–1.9)

## 2016-10-09 MED ORDER — SODIUM CHLORIDE 0.9 % IV BOLUS (SEPSIS)
2000.0000 mL | Freq: Once | INTRAVENOUS | Status: AC
  Administered 2016-10-09: 2000 mL via INTRAVENOUS

## 2016-10-09 MED ORDER — SODIUM CHLORIDE 0.9 % IV BOLUS (SEPSIS): 1000.0000 mL | Freq: Once | INTRAVENOUS | Status: DC

## 2016-10-09 MED ORDER — PROMETHAZINE HCL 25 MG/ML IJ SOLN: 25.0000 mg | Freq: Once | INTRAMUSCULAR | Status: DC

## 2016-10-09 MED ORDER — SODIUM CHLORIDE 0.9 % IV BOLUS (SEPSIS)
1000.0000 mL | Freq: Once | INTRAVENOUS | Status: AC
  Administered 2016-10-09: 1000 mL via INTRAVENOUS

## 2016-10-09 MED ORDER — PROMETHAZINE HCL 25 MG/ML IJ SOLN
25.0000 mg | Freq: Once | INTRAMUSCULAR | Status: AC
  Administered 2016-10-09: 25 mg via INTRAVENOUS
  Filled 2016-10-09: qty 1

## 2016-10-09 NOTE — ED Provider Notes (Signed)
[facility] DEPT Provider Note   CSN: [id number] Arrival date & time: 10/09/16  1344     History   Chief Complaint Chief Complaint  Patient presents with  . Emesis    HPI Todd Aguirre is a 46 y.o. male.  The history is provided by the patient and a parent.  Emesis   This is a new problem. The current episode started 2 days ago. The problem occurs 5 to 10 times per day. The problem has not changed since onset.The emesis has an appearance of stomach contents. Maximum temperature: subjective. The fever has been present for 1 to 2 days. Associated symptoms include abdominal pain, chills and a fever. Pertinent negatives include no cough, no diarrhea and no URI.    Past Medical History:  Diagnosis Date  . Asthma     Patient Active Problem List   Diagnosis Date Noted  . Alcohol withdrawal ([place])   . Dehydration   . Alcoholic ketoacidosis 86/75/4492  . Acidosis 11/26/2015  . Hyperparathyroidism, primary ([place]) 03/22/2013    Past Surgical History:  Procedure Laterality Date  . EXPLORATORY LAPAROTOMY    . HERNIA REPAIR     inguinal x 2  . percutaneous lithotomy     stone over 2 cm       Home Medications    Prior to Admission medications   Medication Sig Start Date End Date Taking? Authorizing Provider  omeprazole (PRILOSEC) 20 MG capsule Take 1 capsule (20 mg total) by mouth daily. Can fill this if recurrent gastritis/reflux 11/27/15   Domenic Polite, MD    Family History Family History  Problem Relation Age of Onset  . Cancer Mother     breast  . Cancer Father     throat    Social History Social History  Substance Use Topics  . Smoking status: Former Research scientist (life sciences)  . Smokeless tobacco: Never Used  . Alcohol use Yes     Allergies   Sertraline hcl and Oxycodone hcl   Review of Systems Review of Systems  Constitutional: Positive for chills, fatigue and fever. Negative for appetite change and diaphoresis.  HENT: Negative for congestion.     Respiratory: Negative for cough, chest tightness, shortness of breath, wheezing and stridor.   Cardiovascular: Negative for chest pain, palpitations and leg swelling.  Gastrointestinal: Positive for abdominal distention, abdominal pain, constipation, nausea and vomiting. Negative for diarrhea.  Genitourinary: Negative for dysuria and flank pain.  Musculoskeletal: Negative for back pain and neck pain.  Skin: Negative for rash.  All other systems reviewed and are negative.    Physical Exam Updated Vital Signs BP (!) 162/90 (BP Location: Right Arm)   Pulse (!) 129   Temp 97.7 F (36.5 C) (Oral)   Resp 14   Ht 5' 9"  (1.753 m)   Wt 135 lb (61.2 kg)   SpO2 100%   BMI 19.94 kg/m   Physical Exam  Constitutional: He is oriented to person, place, and time. He appears well-developed and well-nourished. No distress.  HENT:  Head: Normocephalic and atraumatic.  Right Ear: External ear normal.  Left Ear: External ear normal.  Nose: Nose normal.  Mouth/Throat: Oropharynx is clear and moist. No oropharyngeal exudate.  Eyes: Conjunctivae and EOM are normal. Pupils are equal, round, and reactive to light.  Neck: Normal range of motion. Neck supple.  Cardiovascular: Normal heart sounds and intact distal pulses.  Tachycardia present.   No murmur heard. Pulmonary/Chest: Effort normal and breath sounds normal. No stridor. No respiratory distress.  He has no wheezes. He has no rales. He exhibits no tenderness.  Abdominal: Soft. Normal appearance. He exhibits no distension. There is tenderness in the epigastric area. There is no rebound and no guarding.    Musculoskeletal: He exhibits no edema or tenderness.  Neurological: He is alert and oriented to person, place, and time. No sensory deficit. He exhibits normal muscle tone.  Skin: Skin is warm. Capillary refill takes less than 2 seconds. No rash noted. He is not diaphoretic. No erythema. No pallor.  Psychiatric: He has a normal mood and affect.   Nursing note and vitals reviewed.    ED Treatments / Results  Labs (all labs ordered are listed, but only abnormal results are displayed) Labs Reviewed  COMPREHENSIVE METABOLIC PANEL - Abnormal; Notable for the following:       Result Value   CO2 12 (*)    Glucose, Bld 111 (*)    Creatinine, Ser 1.50 (*)    AST 110 (*)    ALT 67 (*)    Alkaline Phosphatase 132 (*)    GFR calc non Af Amer 55 (*)    Anion gap 28 (*)    All other components within normal limits  CBC - Abnormal; Notable for the following:    WBC 15.1 (*)    RBC 3.86 (*)    Hemoglobin 11.8 (*)    HCT 38.2 (*)    All other components within normal limits  URINALYSIS, ROUTINE W REFLEX MICROSCOPIC - Abnormal; Notable for the following:    Color, Urine STRAW (*)    Hgb urine dipstick SMALL (*)    Ketones, ur 80 (*)    Protein, ur 100 (*)    All other components within normal limits  DIFFERENTIAL - Abnormal; Notable for the following:    Neutro Abs 12.8 (*)    Monocytes Absolute 1.1 (*)    All other components within normal limits  COMPREHENSIVE METABOLIC PANEL - Abnormal; Notable for the following:    Glucose, Bld 145 (*)    Calcium 8.3 (*)    Total Protein 5.8 (*)    Albumin 3.1 (*)    AST 63 (*)    All other components within normal limits  CBC - Abnormal; Notable for the following:    RBC 3.15 (*)    Hemoglobin 9.6 (*)    HCT 30.5 (*)    All other components within normal limits  LACTIC ACID, PLASMA - Abnormal; Notable for the following:    Lactic Acid, Venous 2.3 (*)    All other components within normal limits  GLUCOSE, CAPILLARY - Abnormal; Notable for the following:    Glucose-Capillary 109 (*)    All other components within normal limits  URINALYSIS, ROUTINE W REFLEX MICROSCOPIC - Abnormal; Notable for the following:    Ketones, ur 5 (*)    All other components within normal limits  I-STAT CG4 LACTIC ACID, ED - Abnormal; Notable for the following:    Lactic Acid, Venous 3.58 (*)    All other  components within normal limits  I-STAT CG4 LACTIC ACID, ED - Abnormal; Notable for the following:    Lactic Acid, Venous 4.30 (*)    All other components within normal limits  LIPASE, BLOOD  PROTIME-INR  LACTIC ACID, PLASMA  HIV ANTIBODY (ROUTINE TESTING)  HEPATITIS A ANTIBODY, TOTAL  HEPATITIS A ANTIBODY, IGM  HEPATITIS B CORE ANTIBODY, TOTAL  HEPATITIS B SURFACE ANTIBODY  HEPATITIS C ANTIBODY  I-STAT TROPOININ, ED  I-STAT TROPOININ,  ED    EKG  EKG Interpretation  Date/Time:  Thursday October 09 2016 16:55:08 EDT Ventricular Rate:  135 PR Interval:    QRS Duration: 83 QT Interval:  291 QTC Calculation: 437 R Axis:   75 Text Interpretation:  Sinus tachycardia Atrial premature complexes in couplets Abnormal R-wave progression, early transition When compared to prior, no significant changes.  No STEMI Confirmed by [facility] [place] MD, [place] [phone]) on 10/09/2016 6:08:07 PM       Radiology US Abdomen Complete  Result Date: 10/09/2016 CLINICAL DATA:  Abdominal pain for 2 days.  Nausea and vomiting. EXAM: ABDOMEN ULTRASOUND COMPLETE COMPARISON:  Right upper quadrant ultrasound 11/26/2015. Abdominal CT 08/10/2009 FINDINGS: Gallbladder: Physiologically distended. No gallstones or wall thickening visualized. No sonographic Murphy sign noted by sonographer. Common bile duct: Diameter: 2-3 mm. Liver: No focal lesion identified. Within normal limits in parenchymal echogenicity. Normal directional flow in the main portal vein. IVC: No abnormality visualized. Pancreas: Visualized portion unremarkable. Spleen: Size and appearance within normal limits. Right Kidney: Length: 13.1 cm. Mild thinning of the renal parenchyma. No hydronephrosis. Complex cyst in the upper kidney measures 2.5 x 2.1 x 1.9 cm complex cyst in the lower kidney measures 2.4 x 1.7 x 2.8 cm. Shadowing calculus in the lower pole measures 8 mm. Left Kidney: Length: 13.3 cm. Mild thinning of renal parenchyma. No hydronephrosis. Complex  cyst in the upper left kidney measures 3.1 x 2.5 x 2.8 cm. Complex cyst in the lower kidney measures 2.1 x 2.2 x 2.3 cm. Shadowing calculus largest measuring 1.6 cm. Abdominal aorta: No aneurysm visualized. Other findings: No ascites or free fluid. IMPRESSION: 1. Complex cysts in both kidneys, largest on the right measuring 2.8 cm, largest on the left measuring 3.1 cm. These lesions are present on prior CT in grossly unchanged in size, however direct comparison is difficult given cross modality comparison. Additional renal lesions on prior CT are not well characterized sonographically. Recommend further characterization on a nonemergent basis with renal protocol MRI or CT (MRI is favored) without and with IV contrast. 2. Bilateral nonobstructing renal stones. 3. Normal sonographic appearance of gallbladder and biliary tree. Electronically Signed   By: Jeb Levering M.D.   On: 10/09/2016 18:30   Dg Abdomen Acute W/chest  Result Date: 10/09/2016 CLINICAL DATA:  Abdominal pain with nausea vomiting. EXAM: DG ABDOMEN ACUTE W/ 1V CHEST COMPARISON:  Abdomen film from 11/26/2015 FINDINGS: The lungs are clear wiithout focal pneumonia, edema, pneumothorax or pleural effusion. Symmetric nodular densities projecting over each lung base are compatible with nipple shadows. The cardiopericardial silhouette is within normal limits for size. The visualized bony structures of the thorax are intact. Telemetry leads overlie the chest. Upright film shows no evidence for intraperitoneal free air. There is no evidence for gaseous bowel dilation to suggest obstruction. Radiodense stones overlying each kidney suggest nephrolithiasis. Patient is status post mesh placement in the pelvis. IMPRESSION: 1. No acute cardiopulmonary findings. 2. No evidence for bowel perforation or obstruction. 3. Probable nephrolithiasis. Electronically Signed   By: Misty Stanley M.D.   On: 10/09/2016 17:32    Procedures Procedures (including critical  care time)  Medications Ordered in ED Medications  heparin injection 5,000 Units (not administered)  sodium chloride flush (NS) 0.9 % injection 3 mL (3 mLs Intravenous Not Given 10/10/16 1046)  0.9 %  sodium chloride infusion ( Intravenous New Bag/Given 10/10/16 0943)  HYDROcodone-acetaminophen (NORCO/VICODIN) 5-325 MG per tablet 1-2 tablet (not administered)  promethazine (PHENERGAN) tablet 12.5 mg (not administered)  senna-docusate (Senokot-S) tablet 1 tablet (not administered)  LORazepam (ATIVAN) tablet 1 mg (not administered)    Or  LORazepam (ATIVAN) injection 1 mg (not administered)  thiamine (VITAMIN B-1) tablet 100 mg (100 mg Oral Given 08/22/58 1093)  folic acid (FOLVITE) tablet 1 mg (1 mg Oral Given 10/10/16 0944)  multivitamin with minerals tablet 1 tablet (1 tablet Oral Given 10/10/16 0943)  sodium chloride 0.9 % bolus 2,000 mL (0 mLs Intravenous Stopped 10/09/16 1922)  promethazine (PHENERGAN) injection 25 mg (25 mg Intravenous Given 10/09/16 1651)  sodium chloride 0.9 % bolus 1,000 mL (0 mLs Intravenous Stopped 10/09/16 2122)     Initial Impression / Assessment and Plan / ED Course  I have reviewed the triage vital signs and the nursing notes.  Pertinent labs & imaging results that were available during my care of the patient were reviewed by me and considered in my medical decision making (see chart for details).     Todd Aguirre is a 46 y.o. male With a past medical history significant for asthma, kidney stones, and prior hernia repair who presents with several days of nausea, vomiting, constipation, abdominal pain, decrease in flatus, subjective fevers, and chills. Patient is accompanied by family who reports that patient symptoms of an ongoing since Monday. Since that time, patient reports severely decreased food and fluid intake. Patient says that he has had constipation in the past but says that he has not had a bowel movement several days and has had decrease in  passing gas. He reports not passing gas today. Initially, patient is concerned he has abdominal distention. Patient is tachycardic on arrival with a heart rate in the 140s.  Patient denied rhinorrhea or congestion. Patient reported a mild dry cough.  Based on patient severe tachycardia, there's clinical concern for infection. Patient had workup to look for pneumonia, UTI, and abdominal infections. Initial lab testing showed concern for elevated LFT and alk phos. Given the patient's abdominal pain and tenderness, right upper quadrant ultrasound was ordered. X-ray of the chest and abdomen was also performed to look for obstruction given the decreased flatus. Patient refused CT scan to further workup the abdominal pain. During workup, patient reports he began passing flatus.  Patient lactic acid elevated. After 2 L of fluid, lactic acid further increased. Given the rising lactic acid, patients continue tachycardia which was slightly improved, and the patient's intolerance of PO, do not feel patient was safer discharge. Next paragraph suspect a viral gastritis causing his symptoms leading to the profound dehydration and subsequent lactic acidosis and tachycardia. Abdominal ultrasound did not show evidence of acute cholecystitis. Imaging did not show evidence of obstruction or perforation.  Antibiotics were initially withheld because no source was found.  Given patient's vitals and results, patient will be admitted for further rehydration and observation. Anticipate improvement in symptoms with rehydration and. Patient lactic acid continues to rise, would consider starting in. Antibiotics for unknown source infection.  Patient admitted in stable condition with improvement in his nausea.   Final Clinical Impressions(s) / ED Diagnoses   Final diagnoses:  Non-intractable vomiting with nausea, unspecified vomiting type  Epigastric pain  Dehydration  Lactic acidosis  Unable to pass flatus  Constipation,  unspecified constipation type  Tachycardia     Clinical Impression: 1. Non-intractable vomiting with nausea, unspecified vomiting type   2. Epigastric pain   3. Dehydration   4. Lactic acidosis   5. Unable to pass flatus   6. Constipation, unspecified constipation type  7. Tachycardia     Disposition: Admit to Hospitalist service    Courtney Paris, MD 10/10/16 [place] [place]

## 2016-10-09 NOTE — ED Notes (Signed)
Dr. Tegeler notified of elevated CG-4 

## 2016-10-09 NOTE — H&P (Addendum)
History and Physical    Todd Aguirre XLK:[id number] DOB: [birth year] DOA: 10/09/2016  PCP: Horald Pollen., PA-C  - says that he does not see him Patient coming from: Home  Chief Complaint: headache, nausea, vomiting.   HPI: Todd Aguirre is a 46 y.o. male with medical history significant of daily ETOH use, depression, kidney stones who presents for 4 day history of malaise.  He reports the symptoms started on Monday and he did not feel like eating or drinking.  This continued for about 2 days and he had almost no intake, nausea and decreased urine output.  His family took him to the doctor on Wednesday and he was diagnosed with a URI and given a Zpack.  He notes no cough, wheezing, congestion.  Today, he developed a headache, muscle aches and vomiting.  He had emesis which was stomach contents after drinking water.  He further developed SOB with walking a short distance.  He feels that he is dehydrated because of the lack of eating or drinking for 4 days.  Associated symptoms include subjective fever, chills, epigastric abdominal pain, lightheadedness without loss of consciousness. He has no dysuria.  He now has a sore throat from vomiting today.  He has a sick contact in a coworker who had a vomiting illness.  He has not travelled recently, eaten any new food.  Denies diarrhea or rash.  He has a history of daily ETOH use, 3 shots of liquor per day.  He has withdrawn from ETOH before with shakes and nausea. His last drink was Sunday.  He thinks some of these symptoms could be related to acute withdrawal.  He notes that his headache is improved with fluids and a nap in the ED.    ED Course: In the ED, he was found to have a mild LFT elevation; AST > ALT, Cr was 1.5 up from a normal baseline, WBC 15.1 with H/H of 11.8 and 38.2.  His lactic acid was high at 3.58 and worsened to 4.3.  He received 5L of NS followed by NS at 125cc/hr and lactate improved to 2.3.  There was concern for sepsis given  this acute picture, however, UA was normal and there was no obvious findings on CXR/AXR, abdominal ultrasound to explain acute findings.  Viral illness was favored.    Review of Systems: As per HPI otherwise 10 point review of systems negative.    Past Medical History:  Diagnosis Date  . Asthma   . Chronic kidney disease   . Kidney stones     Past Surgical History:  Procedure Laterality Date  . EXPLORATORY LAPAROTOMY    . HERNIA REPAIR     inguinal x 2  . percutaneous lithotomy     stone over 2 cm   Reviewed with patient.   reports that he has quit smoking. He has never used smokeless tobacco. He reports that he drinks about 12.6 oz of alcohol per week . He reports that he does not use drugs.  Allergies  Allergen Reactions  . Sertraline Hcl Other (See Comments)    Sexual dysfunction  . Oxycodone Hcl     Family History  Problem Relation Age of Onset  . Cancer Mother     breast  . Depression Mother   . Cancer Father     throat  . Kidney Stones Father     Reviewed with patient, reports hydrocodone when kidney stone pains are an issue.  Prior to Admission medications   Medication  Sig Start Date End Date Taking? Authorizing Provider  omeprazole (PRILOSEC) 20 MG capsule Take 1 capsule (20 mg total) by mouth daily. Can fill this if recurrent gastritis/reflux Patient not taking: Reported on 10/09/2016 11/27/15   Zannie Cove, MD    Physical Exam: Vitals:   10/09/16 2245 10/09/16 2330 10/10/16 0015 10/10/16 0045  BP: 129/74 120/76 117/73 117/67  Pulse: (!) 118 (!) 103 (!) 109 (!) 101  Resp: (!) Temp:    99.3 F (37.4 C)  TempSrc:    Oral  SpO2: 100% 98% 97% 99%  Weight:    132 lb 8 oz (60.1 kg)  Height:     (1.753 m)    Constitutional: Thin, young, NAD Vitals:   10/09/16 2245 10/09/16 2330 10/10/16 0015 10/10/16 0045  BP: 129/74 120/76 117/73 117/67  Pulse: (!) 118 (!) 103 (!) 109 (!) 101  Resp: (!) Temp:    99.3 F (37.4 C)    TempSrc:    Oral  SpO2: 100% 98% 97% 99%  Weight:    132 lb 8 oz (60.1 kg)  Height:     (1.753 m)   Eyes: Lids and conjunctivae normal, anicteric sclerae ENMT: Mucous membranes are mildly dry. Normal dentition Neck: normal, supple Respiratory: clear to auscultation bilaterally, no wheezing, no crackles. Normal respiratory effort.  Cardiovascular: tachycardic rate and regular rhythm, no murmurs / rubs / gallops. No extremity edema. 2+ pedal pulses.  Abdomen: no tenderness, no masses palpated.  +BS, no distention Musculoskeletal: no clubbing / cyanosis.  Normal muscle tone.  Skin: no rashes, lesions, ulcers. Neurologic: No focal deficits, moving all extremities Psychiatric: Normal judgment and insight. Alert and oriented x 3. Affect somewhat flat   Labs on Admission: I have personally reviewed following labs and imaging studies  CBC:  Recent Labs Lab 10/09/16 1420 10/09/16 2359 10/10/16 0148  WBC 15.1*  --  6.9  NEUTROABS  --  12.8*  --   HGB 11.8*  --  9.6*  HCT 38.2*  --  30.5*  MCV 99.0  --  96.8  PLT 396  --  215   Basic Metabolic Panel:  Recent Labs Lab 10/09/16 1420 10/10/16 0148  NA 145 141  K 4.3 3.9  CL 105 109  CO2 12* 23  GLUCOSE 111* 145*  BUN 16 11  CREATININE 1.50* 1.13  CALCIUM 9.1 8.3*   GFR: Estimated Creatinine Clearance: 70.2 mL/min (by C-G formula based on SCr of 1.13 mg/dL). Liver Function Tests:  Recent Labs Lab 10/09/16 1420 10/10/16 0148  AST 110* 63*  ALT 67* 44  ALKPHOS 132* 93  BILITOT 1.1 1.0  PROT 7.7 5.8*  ALBUMIN 4.3 3.1*    Recent Labs Lab 10/09/16 1420  LIPASE 15   No results for input(s): AMMONIA in the last 168 hours. Coagulation Profile:  Recent Labs Lab 10/09/16 1820  INR 1.07   Cardiac Enzymes: No results for input(s): CKTOTAL, CKMB, CKMBINDEX, TROPONINI in the last 168 hours. BNP (last 3 results) No results for input(s): PROBNP in the last 8760 hours. HbA1C: No results for input(s): HGBA1C in  the last 72 hours. CBG: No results for input(s): GLUCAP in the last 168 hours. Lipid Profile: No results for input(s): CHOL, HDL, LDLCALC, TRIG, CHOLHDL, LDLDIRECT in the last 72 hours. Thyroid Function Tests: No results for input(s): TSH, T4TOTAL, FREET4, T3FREE, THYROIDAB in the last 72 hours. Anemia Panel: No results for input(s): VITAMINB12, FOLATE, FERRITIN,  TIBC, IRON, RETICCTPCT in the last 72 hours. Urine analysis:    Component Value Date/Time   COLORURINE STRAW (A) 10/09/2016 1437   APPEARANCEUR CLEAR 10/09/2016 1437   LABSPEC 1.012 10/09/2016 1437   PHURINE 5.0 10/09/2016 1437   GLUCOSEU NEGATIVE 10/09/2016 1437   HGBUR SMALL (A) 10/09/2016 1437   BILIRUBINUR NEGATIVE 10/09/2016 1437   KETONESUR 80 (A) 10/09/2016 1437   PROTEINUR 100 (A) 10/09/2016 1437   NITRITE NEGATIVE 10/09/2016 1437   LEUKOCYTESUR NEGATIVE 10/09/2016 1437    Radiological Exams on Admission: US Abdomen Complete  Result Date: 10/09/2016 CLINICAL DATA:  Abdominal pain for 2 days.  Nausea and vomiting. EXAM: ABDOMEN ULTRASOUND COMPLETE COMPARISON:  Right upper quadrant ultrasound 11/26/2015. Abdominal CT 08/10/2009 FINDINGS: Gallbladder: Physiologically distended. No gallstones or wall thickening visualized. No sonographic Murphy sign noted by sonographer. Common bile duct: Diameter: 2-3 mm. Liver: No focal lesion identified. Within normal limits in parenchymal echogenicity. Normal directional flow in the main portal vein. IVC: No abnormality visualized. Pancreas: Visualized portion unremarkable. Spleen: Size and appearance within normal limits. Right Kidney: Length: 13.1 cm. Mild thinning of the renal parenchyma. No hydronephrosis. Complex cyst in the upper kidney measures 2.5 x 2.1 x 1.9 cm complex cyst in the lower kidney measures 2.4 x 1.7 x 2.8 cm. Shadowing calculus in the lower pole measures 8 mm. Left Kidney: Length: 13.3 cm. Mild thinning of renal parenchyma. No hydronephrosis. Complex cyst in the  upper left kidney measures 3.1 x 2.5 x 2.8 cm. Complex cyst in the lower kidney measures 2.1 x 2.2 x 2.3 cm. Shadowing calculus largest measuring 1.6 cm. Abdominal aorta: No aneurysm visualized. Other findings: No ascites or free fluid. IMPRESSION: 1. Complex cysts in both kidneys, largest on the right measuring 2.8 cm, largest on the left measuring 3.1 cm. These lesions are present on prior CT in grossly unchanged in size, however direct comparison is difficult given cross modality comparison. Additional renal lesions on prior CT are not well characterized sonographically. Recommend further characterization on a nonemergent basis with renal protocol MRI or CT (MRI is favored) without and with IV contrast. 2. Bilateral nonobstructing renal stones. 3. Normal sonographic appearance of gallbladder and biliary tree. Electronically Signed   By: Rubye Oaks M.D.   On: 10/09/2016 18:30   Dg Abdomen Acute W/chest  Result Date: 10/09/2016 CLINICAL DATA:  Abdominal pain with nausea vomiting. EXAM: DG ABDOMEN ACUTE W/ 1V CHEST COMPARISON:  Abdomen film from 11/26/2015 FINDINGS: The lungs are clear wiithout focal pneumonia, edema, pneumothorax or pleural effusion. Symmetric nodular densities projecting over each lung base are compatible with nipple shadows. The cardiopericardial silhouette is within normal limits for size. The visualized bony structures of the thorax are intact. Telemetry leads overlie the chest. Upright film shows no evidence for intraperitoneal free air. There is no evidence for gaseous bowel dilation to suggest obstruction. Radiodense stones overlying each kidney suggest nephrolithiasis. Patient is status post mesh placement in the pelvis. IMPRESSION: 1. No acute cardiopulmonary findings. 2. No evidence for bowel perforation or obstruction. 3. Probable nephrolithiasis. Electronically Signed   By: Kennith Center M.D.   On: 10/09/2016 17:32    EKG: Independently reviewed. Sinus  tachycardia  Assessment/Plan Decreased PO intake leading to AKI (acute kidney injury)  - Cr up to 1.5 on admission, improved with fluids to 1.13 - IVF with NS at 125cc/hr - Repeat BMET for evaluation - Reported in chart as having CKD, trend Cr  Viral illness/lactic acidosis - DDx includes viral gastroenteritis,  bacterial gastroenteritis, other viral illness - WBC 15.1 initially  - Initially with elevated lactate, elevated WBC, low bicarb, elevated Cr, AG of 28 - this all improved with fluids alone - Monitor, if spikes fever, consider starting Abx to cover GI pathogens - Clear liquid diet - Nausea control with phenergan - Trend lactate until normalizes - GI pathogen panel sent by ED.  - Tachycardia also improved with fluids to low 100s.   ETOH abuse/transaminitis - CIWA protocol, last drink Sunday - Thiamine, MVI, folate - Cessation counseling - Check HAV, HBV, HCV, HIV  Normocytic anemia - On repeat CBC, H/H down to 9.6/30.5 after 5L of fluids - MCV 96.8 - Check Iron, ferritin, b12, folate  Renal cysts - Recommend outpatient MRI - non urgent   DVT prophylaxis: Heparin Code Status: Full Disposition Plan: d/c in 1-2 days Consults called: None Admission status: Obs, telemetry   Debe Coder MD [facility] Pager [phone]  If 7PM-7AM, please contact night-coverage [email or web address] Password [facility]  10/10/2016, 4:35 AM

## 2016-10-09 NOTE — ED Notes (Signed)
Cody Albus, pt's father, [phone] (cell), if needed.

## 2016-10-09 NOTE — ED Triage Notes (Signed)
Per EMS: pt from home c/o N/V x 2 days; pt sts body aches; IV 20g L FA; pt given NS and  zofran

## 2016-10-09 NOTE — ED Notes (Signed)
Patient transported to X-ray 

## 2016-10-09 NOTE — ED Notes (Signed)
Pt remains in imaging.

## 2016-10-10 ENCOUNTER — Encounter (HOSPITAL_COMMUNITY): Payer: Self-pay

## 2016-10-10 DIAGNOSIS — N179 Acute kidney failure, unspecified: Secondary | ICD-10-CM | POA: Diagnosis not present

## 2016-10-10 DIAGNOSIS — E872 Acidosis, unspecified: Secondary | ICD-10-CM

## 2016-10-10 DIAGNOSIS — E860 Dehydration: Secondary | ICD-10-CM

## 2016-10-10 DIAGNOSIS — R112 Nausea with vomiting, unspecified: Secondary | ICD-10-CM | POA: Diagnosis not present

## 2016-10-10 DIAGNOSIS — R000 Tachycardia, unspecified: Secondary | ICD-10-CM

## 2016-10-10 LAB — CBC
HCT: 30.5 % — ABNORMAL LOW (ref 39.0–52.0)
HEMOGLOBIN: 9.6 g/dL — AB (ref 13.0–17.0)
MCH: 30.5 pg (ref 26.0–34.0)
MCHC: 31.5 g/dL (ref 30.0–36.0)
MCV: 96.8 fL (ref 78.0–100.0)
Platelets: 215 10*3/uL (ref 150–400)
RBC: 3.15 MIL/uL — ABNORMAL LOW (ref 4.22–5.81)
RDW: 14 % (ref 11.5–15.5)
WBC: 6.9 10*3/uL (ref 4.0–10.5)

## 2016-10-10 LAB — GLUCOSE, CAPILLARY: Glucose-Capillary: 109 mg/dL — ABNORMAL HIGH (ref 65–99)

## 2016-10-10 LAB — COMPREHENSIVE METABOLIC PANEL
ALK PHOS: 93 U/L (ref 38–126)
ALT: 44 U/L (ref 17–63)
AST: 63 U/L — AB (ref 15–41)
Albumin: 3.1 g/dL — ABNORMAL LOW (ref 3.5–5.0)
Anion gap: 9 (ref 5–15)
BILIRUBIN TOTAL: 1 mg/dL (ref 0.3–1.2)
BUN: 11 mg/dL (ref 6–20)
CALCIUM: 8.3 mg/dL — AB (ref 8.9–10.3)
CO2: 23 mmol/L (ref 22–32)
CREATININE: 1.13 mg/dL (ref 0.61–1.24)
Chloride: 109 mmol/L (ref 101–111)
GFR calc Af Amer: >60 mL/min (ref >60)
Glucose, Bld: 145 mg/dL — ABNORMAL HIGH (ref 65–99)
Potassium: 3.9 mmol/L (ref 3.5–5.1)
Sodium: 141 mmol/L (ref 135–145)
TOTAL PROTEIN: 5.8 g/dL — AB (ref 6.5–8.1)

## 2016-10-10 LAB — DIFFERENTIAL
BASOS PCT: 0 %
Basophils Absolute: 0 10*3/uL (ref 0.0–0.1)
Eosinophils Absolute: 0 10*3/uL (ref 0.0–0.7)
Eosinophils Relative: 0 %
LYMPHS ABS: 1.1 10*3/uL (ref 0.7–4.0)
Lymphocytes Relative: 8 %
MONO ABS: 1.1 10*3/uL — AB (ref 0.1–1.0)
MONOS PCT: 7 %
NEUTROS ABS: 12.8 10*3/uL — AB (ref 1.7–7.7)
Neutrophils Relative %: 85 %

## 2016-10-10 LAB — URINALYSIS, ROUTINE W REFLEX MICROSCOPIC
Bilirubin Urine: NEGATIVE
Glucose, UA: NEGATIVE mg/dL
Hgb urine dipstick: NEGATIVE
KETONES UR: 5 mg/dL — AB
LEUKOCYTES UA: NEGATIVE
NITRITE: NEGATIVE
PH: 6 (ref 5.0–8.0)
Protein, ur: NEGATIVE mg/dL
Specific Gravity, Urine: 1.013 (ref 1.005–1.030)

## 2016-10-10 LAB — LACTIC ACID, PLASMA
LACTIC ACID, VENOUS: 1.1 mmol/L (ref 0.5–1.9)
Lactic Acid, Venous: 2.3 mmol/L (ref 0.5–1.9)

## 2016-10-10 LAB — HIV ANTIBODY (ROUTINE TESTING W REFLEX): HIV Screen 4th Generation wRfx: NONREACTIVE

## 2016-10-10 MED ORDER — VITAMIN B-1 100 MG PO TABS
100.0000 mg | ORAL_TABLET | Freq: Every day | ORAL | Status: DC
  Administered 2016-10-10: 100 mg via ORAL
  Filled 2016-10-10: qty 1

## 2016-10-10 MED ORDER — SODIUM CHLORIDE 0.9% FLUSH: 3.0000 mL | Freq: Two times a day (BID) | INTRAVENOUS | Status: DC

## 2016-10-10 MED ORDER — ADULT MULTIVITAMIN W/MINERALS CH
1.0000 | ORAL_TABLET | Freq: Every day | ORAL | Status: DC
  Administered 2016-10-10: 1 via ORAL
  Filled 2016-10-10: qty 1

## 2016-10-10 MED ORDER — HEPARIN SODIUM (PORCINE) 5000 UNIT/ML IJ SOLN: 5000.0000 [IU] | Freq: Three times a day (TID) | INTRAMUSCULAR | Status: DC

## 2016-10-10 MED ORDER — FOLIC ACID 1 MG PO TABS
1.0000 mg | ORAL_TABLET | Freq: Every day | ORAL | Status: DC
  Administered 2016-10-10: 1 mg via ORAL
  Filled 2016-10-10: qty 1

## 2016-10-10 MED ORDER — LORAZEPAM 2 MG/ML IJ SOLN: 1.0000 mg | Freq: Four times a day (QID) | INTRAMUSCULAR | Status: DC | PRN

## 2016-10-10 MED ORDER — PROMETHAZINE HCL 25 MG PO TABS: 12.5000 mg | ORAL_TABLET | Freq: Four times a day (QID) | ORAL | Status: DC | PRN

## 2016-10-10 MED ORDER — HYDROCODONE-ACETAMINOPHEN 5-325 MG PO TABS: 1.0000 | ORAL_TABLET | ORAL | Status: DC | PRN

## 2016-10-10 MED ORDER — SODIUM CHLORIDE 0.9 % IV SOLN
INTRAVENOUS | Status: AC
  Administered 2016-10-10 (×2): via INTRAVENOUS

## 2016-10-10 MED ORDER — THIAMINE HCL 100 MG/ML IJ SOLN
100.0000 mg | Freq: Every day | INTRAMUSCULAR | Status: DC
  Filled 2016-10-10: qty 2

## 2016-10-10 MED ORDER — SENNOSIDES-DOCUSATE SODIUM 8.6-50 MG PO TABS: 1.0000 | ORAL_TABLET | Freq: Every evening | ORAL | Status: DC | PRN

## 2016-10-10 MED ORDER — LORAZEPAM 1 MG PO TABS: 1.0000 mg | ORAL_TABLET | Freq: Four times a day (QID) | ORAL | Status: DC | PRN

## 2016-10-10 NOTE — Progress Notes (Addendum)
Discharge instructions and medication discussed with patient. AVS given and patient education given. All questions answered. IV removed.  IV site clean, dry and intact. Pt escorted via wheelchair.   Stephannie Peters, RN

## 2016-10-10 NOTE — Discharge Summary (Addendum)
Physician Discharge Summary  Todd Aguirre:[id number] DOB: June 10, 1971 DOA: 10/09/2016  PCP: Helene Kelp C., PA-C  Admit date: 10/09/2016 Discharge date: 10/10/2016  Time spent: 25 minutes  Recommendations for Outpatient Follow-up:  1. Follow up PCP in 2 weeks   Discharge Diagnoses:  Active Problems:   Dehydration   AKI (acute kidney injury) (HCC)   Lactic acidosis   Non-intractable vomiting with nausea   Tachycardia   Discharge Condition: *Stable  Diet recommendation: Regular diet  Filed Weights   10/09/16 1406 10/10/16 0045  Weight: 61.2 kg (135 lb) 60.1 kg (132 lb 8 oz)    History of present illness:  46 y.o. male with medical history significant of daily ETOH use, depression, kidney stones who presents for 4 day history of malaise.  He reports the symptoms started on Monday and he did not feel like eating or drinking.  This continued for about 2 days and he had almost no intake, nausea and decreased urine output.  His family took him to the doctor on Wednesday and he was diagnosed with a URI and given a Zpack.  He notes no cough, wheezing, congestion.  Today, he developed a headache, muscle aches and vomiting.  He had emesis which was stomach contents after drinking water.  He further developed SOB with walking a short distance.  He feels that he is dehydrated because of the lack of eating or drinking for 4 days.  Associated symptoms include subjective fever, chills, epigastric abdominal pain, lightheadedness without loss of consciousness. He has no dysuria.  He now has a sore throat from vomiting today.  He has a sick contact in a coworker who had a vomiting illness.  He has not travelled recently, eaten any new food.  Denies diarrhea or rash.  He has a history of daily ETOH use, 3 shots of liquor per day.  He has withdrawn from ETOH before with shakes and nausea. His last drink was Sunday.  He thinks some of these symptoms could be related to acute withdrawal.  He notes that  his headache is improved with fluids and a nap in the ED.    ED Course: In the ED, he was found to have a mild LFT elevation; AST > ALT, Cr was 1.5 up from a normal baseline, WBC 15.1 with H/H of 11.8 and 38.2.  His lactic acid was high at 3.58 and worsened to 4.3.  He received 5L of NS followed by NS at 125cc/hr and lactate improved to 2.3.  There was concern for sepsis given this acute picture, however, UA was normal and there was no obvious findings on CXR/AXR, abdominal ultrasound to explain acute findings.  Viral illness was favored  Hospital Course:   Gastroenteritis- resolved, likely viral in the region. Patient was able to eat full liquid diet without nausea, vomiting. Diet has been advanced and patient will be discharged home.  History of alcohol abuse- patient has no signs and symptoms of alcohol withdrawal, he is not interested in outpatient rehabilitation.  GERD-continue PPI   Lactic acidosis- patient's lactic acid was elevated 4.30 which has improved to 1.1 after IV fluids. Likely from dehydration.  Dysuria-patient complains of one time dysuria this morning him a yesterday UA was normal. Repeat UA this morning also showed no acute abnormality. He does have a history of nephrolithiasis. Abdominal x-ray shows possible nephrolithiasis. Patient will follow-up with urology as outpatient  Procedures:  None   Consultations:  None   Discharge Exam: Vitals:   10/10/16 0444  10/10/16 0803  BP: 117/74 120/74  Pulse: 96 85  Resp: 20 16  Temp: 98.8 F (37.1 C) 98.6 F (37 C)    General: Appears in no acute distress Cardiovascular: RRR, S1S2 Respiratory: Clear bilaterally Abdomen- soft, nontender, no organomegaly  Discharge Instructions   Discharge Instructions    Diet - low sodium heart healthy    Complete by:  As directed    Increase activity slowly    Complete by:  As directed      Current Discharge Medication List    CONTINUE these medications which have NOT  CHANGED   Details  omeprazole (PRILOSEC) 20 MG capsule Take 1 capsule (20 mg total) by mouth daily. Can fill this if recurrent gastritis/reflux Qty: 30 capsule, Refills: 0       Allergies  Allergen Reactions  . Sertraline Hcl Other (See Comments)    Sexual dysfunction  . Oxycodone Hcl       The results of significant diagnostics from this hospitalization (including imaging, microbiology, ancillary and laboratory) are listed below for reference.    Significant Diagnostic Studies: US Abdomen Complete  Result Date: 10/09/2016 CLINICAL DATA:  Abdominal pain for 2 days.  Nausea and vomiting. EXAM: ABDOMEN ULTRASOUND COMPLETE COMPARISON:  Right upper quadrant ultrasound 11/26/2015. Abdominal CT 08/10/2009 FINDINGS: Gallbladder: Physiologically distended. No gallstones or wall thickening visualized. No sonographic Murphy sign noted by sonographer. Common bile duct: Diameter: 2-3 mm. Liver: No focal lesion identified. Within normal limits in parenchymal echogenicity. Normal directional flow in the main portal vein. IVC: No abnormality visualized. Pancreas: Visualized portion unremarkable. Spleen: Size and appearance within normal limits. Right Kidney: Length: 13.1 cm. Mild thinning of the renal parenchyma. No hydronephrosis. Complex cyst in the upper kidney measures 2.5 x 2.1 x 1.9 cm complex cyst in the lower kidney measures 2.4 x 1.7 x 2.8 cm. Shadowing calculus in the lower pole measures 8 mm. Left Kidney: Length: 13.3 cm. Mild thinning of renal parenchyma. No hydronephrosis. Complex cyst in the upper left kidney measures 3.1 x 2.5 x 2.8 cm. Complex cyst in the lower kidney measures 2.1 x 2.2 x 2.3 cm. Shadowing calculus largest measuring 1.6 cm. Abdominal aorta: No aneurysm visualized. Other findings: No ascites or free fluid. IMPRESSION: 1. Complex cysts in both kidneys, largest on the right measuring 2.8 cm, largest on the left measuring 3.1 cm. These lesions are present on prior CT in grossly  unchanged in size, however direct comparison is difficult given cross modality comparison. Additional renal lesions on prior CT are not well characterized sonographically. Recommend further characterization on a nonemergent basis with renal protocol MRI or CT (MRI is favored) without and with IV contrast. 2. Bilateral nonobstructing renal stones. 3. Normal sonographic appearance of gallbladder and biliary tree. Electronically Signed   By: Rubye Oaks M.D.   On: 10/09/2016 18:30   Dg Abdomen Acute W/chest  Result Date: 10/09/2016 CLINICAL DATA:  Abdominal pain with nausea vomiting. EXAM: DG ABDOMEN ACUTE W/ 1V CHEST COMPARISON:  Abdomen film from 11/26/2015 FINDINGS: The lungs are clear wiithout focal pneumonia, edema, pneumothorax or pleural effusion. Symmetric nodular densities projecting over each lung base are compatible with nipple shadows. The cardiopericardial silhouette is within normal limits for size. The visualized bony structures of the thorax are intact. Telemetry leads overlie the chest. Upright film shows no evidence for intraperitoneal free air. There is no evidence for gaseous bowel dilation to suggest obstruction. Radiodense stones overlying each kidney suggest nephrolithiasis. Patient is status post mesh placement in  the pelvis. IMPRESSION: 1. No acute cardiopulmonary findings. 2. No evidence for bowel perforation or obstruction. 3. Probable nephrolithiasis. Electronically Signed   By: Kennith Center M.D.   On: 10/09/2016 17:32    Microbiology: No results found for this or any previous visit (from the past 240 hour(s)).   Labs: Basic Metabolic Panel:  Recent Labs Lab 10/09/16 1420 10/10/16 0148  NA 145 141  K 4.3 3.9  CL 105 109  CO2 12* 23  GLUCOSE 111* 145*  BUN 16 11  CREATININE 1.50* 1.13  CALCIUM 9.1 8.3*   Liver Function Tests:  Recent Labs Lab 10/09/16 1420 10/10/16 0148  AST 110* 63*  ALT 67* 44  ALKPHOS 132* 93  BILITOT 1.1 1.0  PROT 7.7 5.8*   ALBUMIN 4.3 3.1*    Recent Labs Lab 10/09/16 1420  LIPASE 15   No results for input(s): AMMONIA in the last 168 hours. CBC:  Recent Labs Lab 10/09/16 1420 10/09/16 2359 10/10/16 0148  WBC 15.1*  --  6.9  NEUTROABS  --  12.8*  --   HGB 11.8*  --  9.6*  HCT 38.2*  --  30.5*  MCV 99.0  --  96.8  PLT 396  --  215    CBG:  Recent Labs Lab 10/10/16 0803  GLUCAP 109*       Signed:  Mauro Kaufmann S MD.  [facility] 10/10/2016, 1:11 PM

## 2016-10-10 NOTE — Progress Notes (Signed)
Notified practitioner of lactic acid 2.3

## 2016-10-10 NOTE — Progress Notes (Deleted)
Notified practitioner of lactic acid 4.3

## 2016-10-10 NOTE — Progress Notes (Signed)
Patient arrived to room  from ED.  Assessment complete, VS obtained, and Admission database began.

## 2016-10-11 LAB — HEPATITIS B CORE ANTIBODY, TOTAL: HEP B C TOTAL AB: NEGATIVE

## 2016-10-11 LAB — HEPATITIS A ANTIBODY, TOTAL: HEP A TOTAL AB: NEGATIVE

## 2016-10-11 LAB — HEPATITIS A ANTIBODY, IGM: HEP A IGM: NEGATIVE

## 2016-10-11 LAB — HEPATITIS C ANTIBODY

## 2016-10-11 LAB — HEPATITIS B SURFACE ANTIBODY,QUALITATIVE: HEP B S AB: NONREACTIVE

## 2017-02-26 ENCOUNTER — Encounter (HOSPITAL_COMMUNITY): Payer: Self-pay | Admitting: Emergency Medicine

## 2017-02-26 ENCOUNTER — Emergency Department (HOSPITAL_COMMUNITY)
Admission: EM | Admit: 2017-02-26 | Discharge: 2017-02-26 | Disposition: A | Discharge status: Home / Self Care | Payer: 59 | Attending: Emergency Medicine | Admitting: Emergency Medicine

## 2017-02-26 DIAGNOSIS — Z5321 Procedure and treatment not carried out due to patient leaving prior to being seen by health care provider: Secondary | ICD-10-CM | POA: Insufficient documentation

## 2017-02-26 NOTE — ED Notes (Signed)
Bed: WA27 Expected date:  Expected time:  Means of arrival:  Comments: Triage

## 2017-02-26 NOTE — ED Triage Notes (Signed)
Patient here from home with complaints of anxiety attack. Reports that he has been off meds for about 3 days. Takes lorazepam. Denies SI/HI.

## 2017-02-26 NOTE — ED Notes (Signed)
Pt walked into hallway and sat in chair then stated he could not go back in room cause his legs wouldn't work. Security was called and patient walked back into room. Pt laid on bed then lowered him self back onto floor. Pt stated again that he couldn't breath and could not get out of floor. This tech told patient he could lay in the floor until he was comfortable to get up. Pt the then stood up and yelled "I can't fucking breath!" and slammed the door.

## 2017-02-26 NOTE — Progress Notes (Signed)
Pt left AMA after approx. 20 minutes in TCU since he had not seen a doctor during that time.  Encouraged patient to stay, but he stated he couldn't wait any longer.

## 2017-02-26 NOTE — ED Notes (Signed)
Pt lowered self to floor and is now laying in floor stating he can not breath. Pt crawling around the floor says he cannot breath if he gets in the bed.

## 2021-07-26 ENCOUNTER — Other Ambulatory Visit: Payer: Self-pay | Admitting: Family Medicine

## 2021-07-26 DIAGNOSIS — R7401 Elevation of levels of liver transaminase levels: Secondary | ICD-10-CM

## 2021-08-05 ENCOUNTER — Ambulatory Visit
Admission: RE | Admit: 2021-08-05 | Discharge: 2021-08-05 | Disposition: A | Discharge status: Home / Self Care | Payer: Self-pay | Source: Ambulatory Visit | Attending: Family Medicine | Admitting: Family Medicine

## 2021-08-05 DIAGNOSIS — R7401 Elevation of levels of liver transaminase levels: Secondary | ICD-10-CM

## 2023-05-02 IMAGING — US US ABDOMEN LIMITED
1 series · 14 of 25 positions shown · non-contrast
Comparison: 10/09/2016

CLINICAL DATA: Elevated serum transaminase levels

EXAM:
ULTRASOUND ABDOMEN LIMITED RIGHT UPPER QUADRANT

[Series 1: us abdomen limited · 0.23mm/px · 14 of 37 slices shown]
[im 1/37]
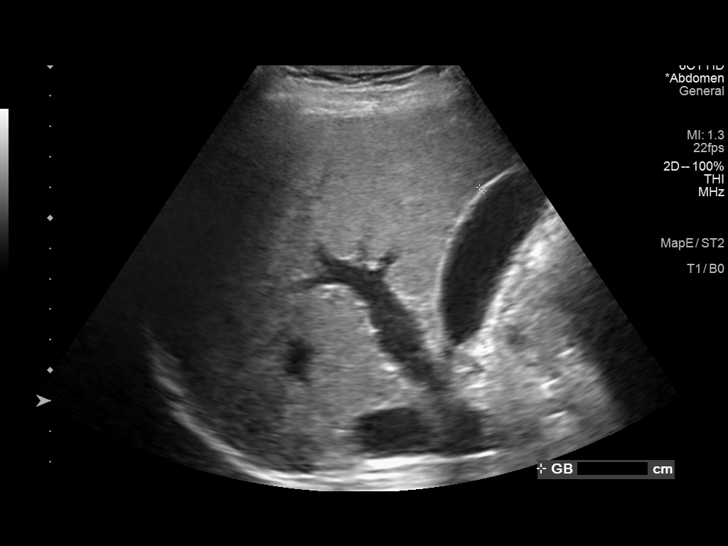
[im 4/37]
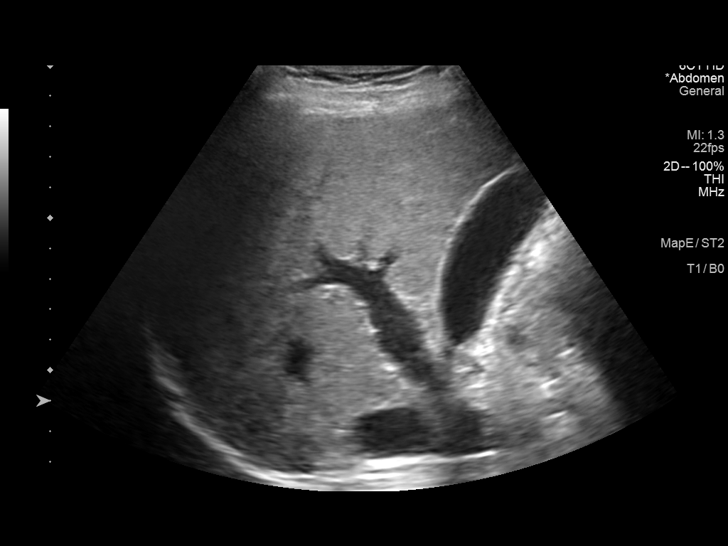
[im 7/37]
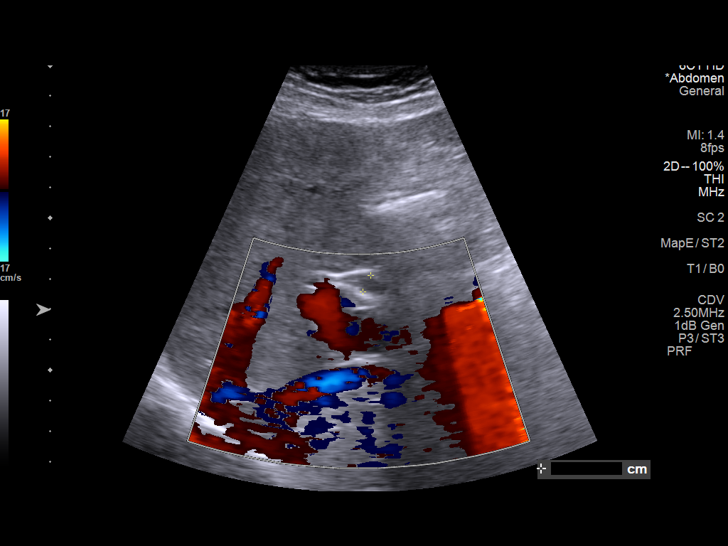
[im 10/37]
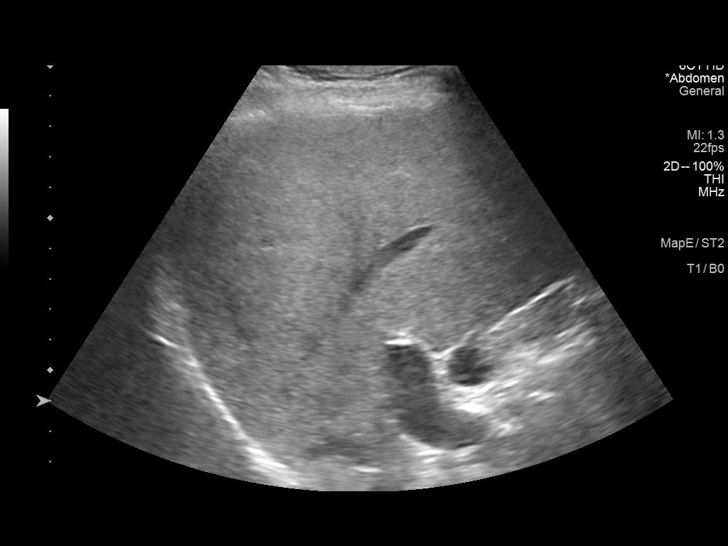
[im 13/37]
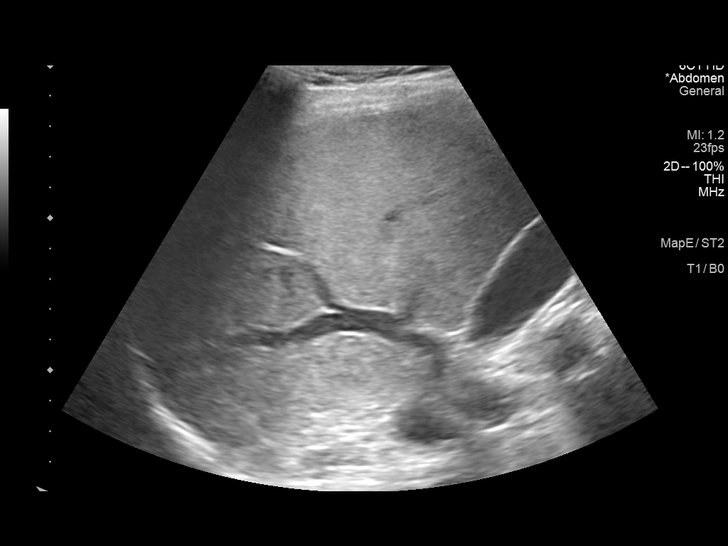
[im 14/37]
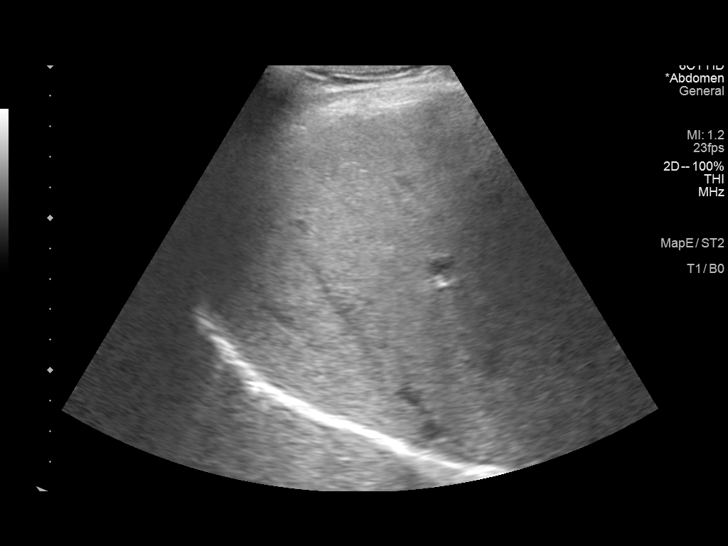
[im 17/37]
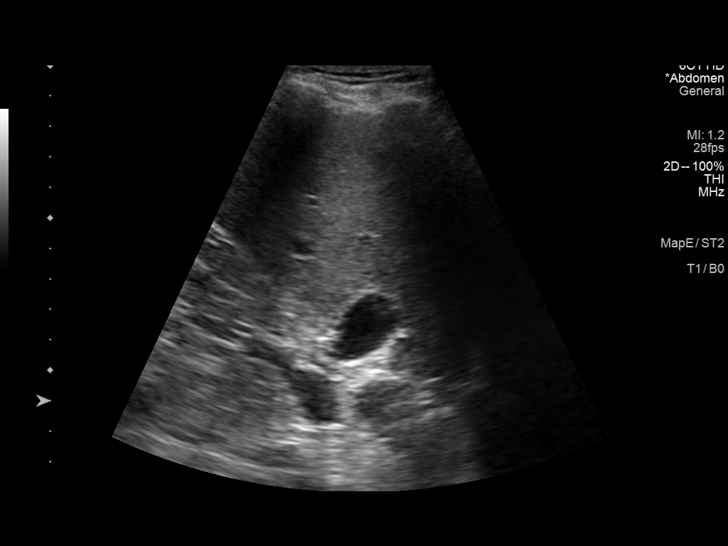
[im 20/37]
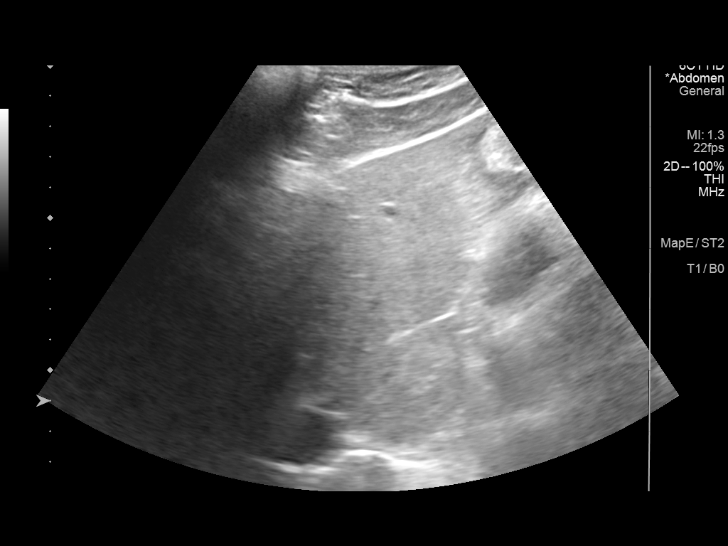
[im 23/37]
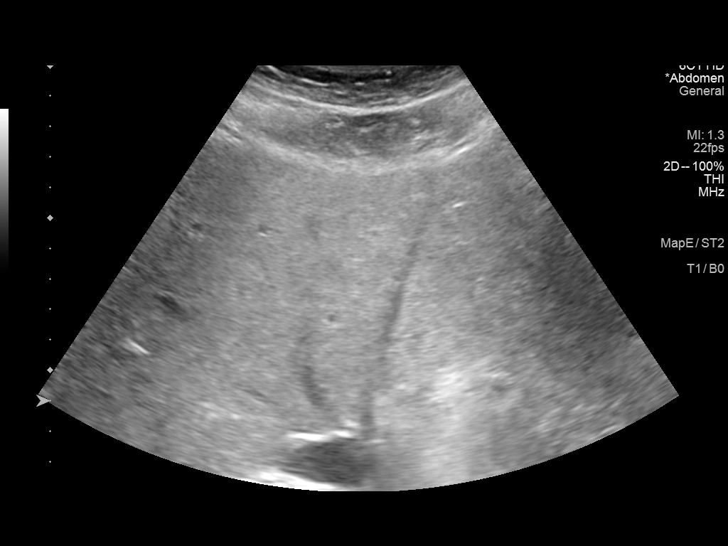
[im 25/37]
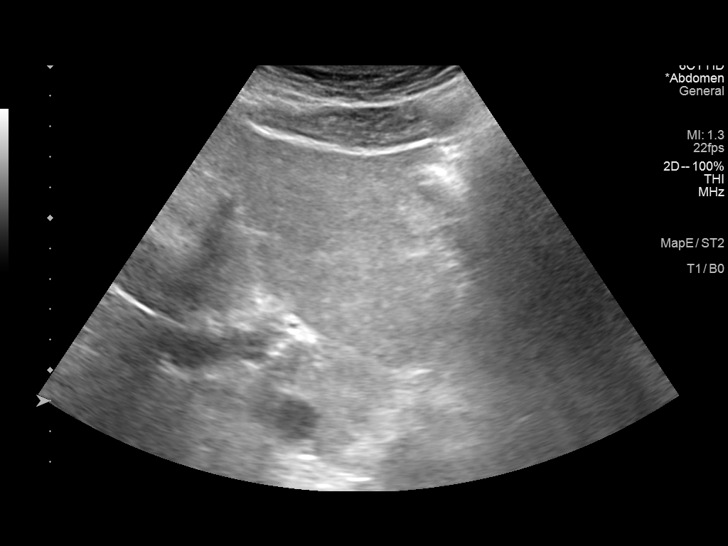
[im 28/37]
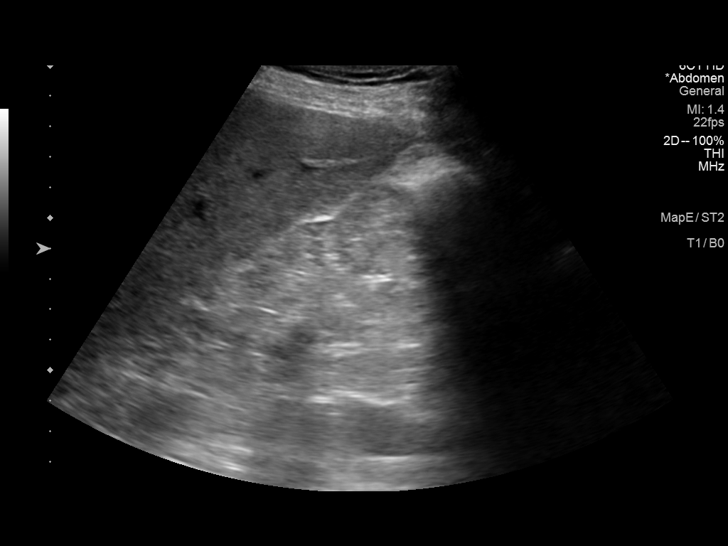
[im 31/37]
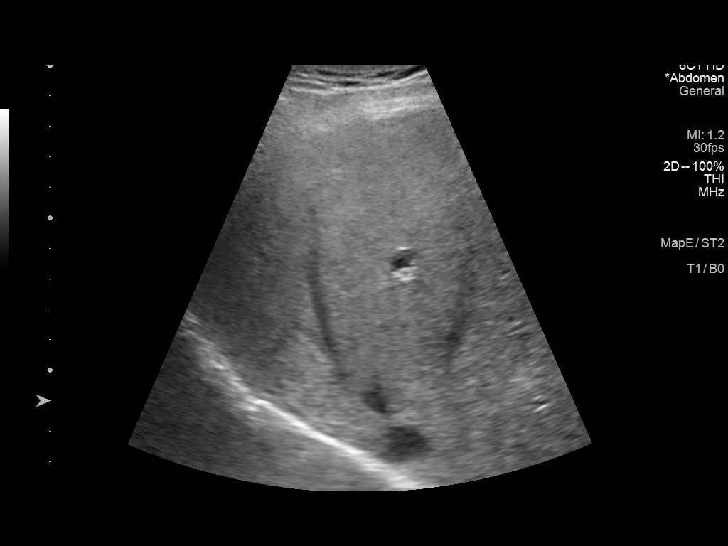
[im 34/37]
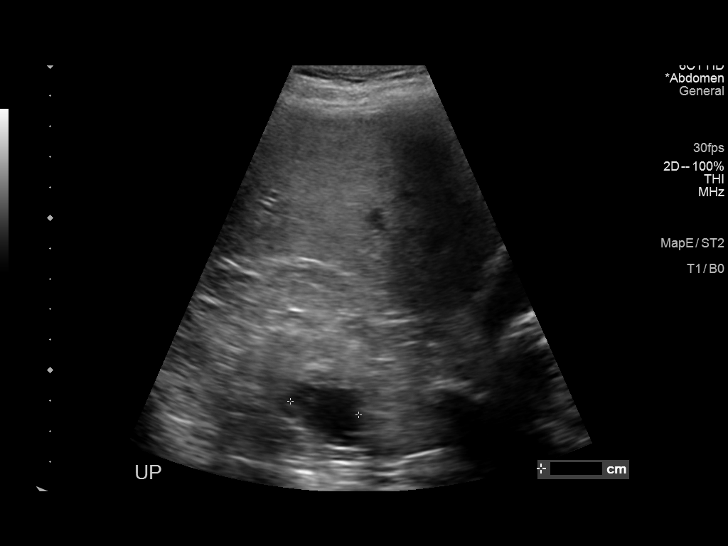
[im 37/37]
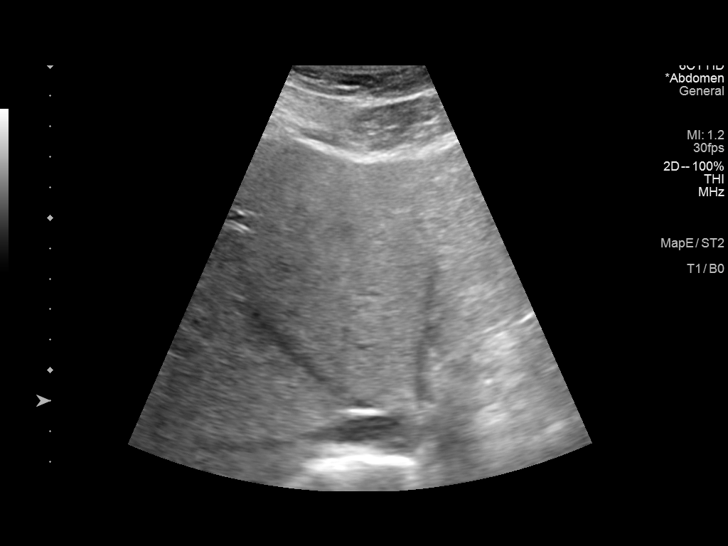

[14 of 25 positions shown; findings below may reference images not displayed]

FINDINGS: Gallbladder:

No gallstones or wall thickening visualized. No sonographic Murphy
sign noted by sonographer.

Common bile duct:

Diameter: 6 mm in proximal diameter

Liver:

No focal lesion identified. Within normal limits in parenchymal
echogenicity. Portal vein is patent on color Doppler imaging with
normal direction of blood flow towards the liver.

Other: The right kidney demonstrates moderate cortical atrophy,
similar to prior examination. 3.1 cm simple cyst noted within the
upper pole.
IMPRESSION: Normal hepatic sonogram.

Moderate right renal cortical atrophy
